# Patient Record
Sex: Male | Born: 1955 | Race: Black or African American | Hispanic: No | Marital: Married | Smoking: Never smoker
Health system: Southern US, Community
[De-identification: ages and names within clinical notes are randomized; demographics above are authoritative.]

## PROBLEM LIST (undated history)

## (undated) DIAGNOSIS — J189 Pneumonia, unspecified organism: Secondary | ICD-10-CM

## (undated) DIAGNOSIS — J45909 Unspecified asthma, uncomplicated: Secondary | ICD-10-CM

## (undated) HISTORY — PX: KNEE ARTHROSCOPY: SUR90

---

## 2013-10-01 ENCOUNTER — Emergency Department (HOSPITAL_COMMUNITY): Payer: 59

## 2013-10-01 ENCOUNTER — Encounter (HOSPITAL_COMMUNITY): Payer: Self-pay | Admitting: Emergency Medicine

## 2013-10-01 ENCOUNTER — Inpatient Hospital Stay (HOSPITAL_COMMUNITY)
Admission: EM | Admit: 2013-10-01 | Discharge: 2013-10-09 | DRG: 164 | Disposition: A | Discharge status: Home / Self Care | Payer: 59 | Attending: Surgery | Admitting: Surgery

## 2013-10-01 DIAGNOSIS — E860 Dehydration: Secondary | ICD-10-CM | POA: Diagnosis present

## 2013-10-01 DIAGNOSIS — J45909 Unspecified asthma, uncomplicated: Secondary | ICD-10-CM | POA: Diagnosis present

## 2013-10-01 DIAGNOSIS — J869 Pyothorax without fistula: Principal | ICD-10-CM | POA: Diagnosis present

## 2013-10-01 DIAGNOSIS — J9819 Other pulmonary collapse: Secondary | ICD-10-CM | POA: Diagnosis present

## 2013-10-01 DIAGNOSIS — Z8709 Personal history of other diseases of the respiratory system: Secondary | ICD-10-CM

## 2013-10-01 DIAGNOSIS — D509 Iron deficiency anemia, unspecified: Secondary | ICD-10-CM | POA: Diagnosis present

## 2013-10-01 DIAGNOSIS — D62 Acute posthemorrhagic anemia: Secondary | ICD-10-CM | POA: Diagnosis not present

## 2013-10-01 DIAGNOSIS — J90 Pleural effusion, not elsewhere classified: Secondary | ICD-10-CM | POA: Diagnosis present

## 2013-10-01 DIAGNOSIS — D72829 Elevated white blood cell count, unspecified: Secondary | ICD-10-CM | POA: Diagnosis present

## 2013-10-01 DIAGNOSIS — N179 Acute kidney failure, unspecified: Secondary | ICD-10-CM | POA: Diagnosis present

## 2013-10-01 HISTORY — DX: Unspecified asthma, uncomplicated: J45.909

## 2013-10-01 HISTORY — DX: Pneumonia, unspecified organism: J18.9

## 2013-10-01 LAB — BASIC METABOLIC PANEL
BUN: 28 mg/dL — AB (ref 6–23)
CO2: 27 meq/L (ref 19–32)
CREATININE: 1.66 mg/dL — AB (ref 0.50–1.35)
Calcium: 9.6 mg/dL (ref 8.4–10.5)
Chloride: 95 mEq/L — ABNORMAL LOW (ref 96–112)
GFR calc Af Amer: 52 mL/min — ABNORMAL LOW (ref >90)
GFR calc non Af Amer: 45 mL/min — ABNORMAL LOW (ref >90)
Glucose, Bld: 154 mg/dL — ABNORMAL HIGH (ref 70–99)
Potassium: 3.2 mEq/L — ABNORMAL LOW (ref 3.7–5.3)
Sodium: 136 mEq/L — ABNORMAL LOW (ref 137–147)

## 2013-10-01 LAB — D-DIMER, QUANTITATIVE: D-Dimer, Quant: 3.89 ug/mL-FEU — ABNORMAL HIGH (ref 0.00–0.48)

## 2013-10-01 LAB — CBC
HCT: 36.6 % — ABNORMAL LOW (ref 39.0–52.0)
Hemoglobin: 12.3 g/dL — ABNORMAL LOW (ref 13.0–17.0)
MCH: 25.4 pg — AB (ref 26.0–34.0)
MCHC: 33.6 g/dL (ref 30.0–36.0)
MCV: 75.6 fL — AB (ref 78.0–100.0)
Platelets: 274 10*3/uL (ref 150–400)
RBC: 4.84 MIL/uL (ref 4.22–5.81)
RDW: 13.6 % (ref 11.5–15.5)
WBC: 17.2 10*3/uL — ABNORMAL HIGH (ref 4.0–10.5)

## 2013-10-01 LAB — TROPONIN I: Troponin I: <0.3 ng/mL (ref <0.30)

## 2013-10-01 MED ORDER — VANCOMYCIN HCL IN DEXTROSE 1-5 GM/200ML-% IV SOLN
1000.0000 mg | Freq: Once | INTRAVENOUS | Status: AC
  Administered 2013-10-01: 1000 mg via INTRAVENOUS
  Filled 2013-10-01: qty 200

## 2013-10-01 MED ORDER — DEXTROSE 5 % IV SOLN
500.0000 mg | Freq: Once | INTRAVENOUS | Status: AC
  Administered 2013-10-01: 500 mg via INTRAVENOUS

## 2013-10-01 MED ORDER — IOHEXOL 300 MG/ML  SOLN
80.0000 mL | Freq: Once | INTRAMUSCULAR | Status: AC | PRN
  Administered 2013-10-01: 80 mL via INTRAVENOUS

## 2013-10-01 MED ORDER — DEXTROSE 5 % IV SOLN
1.0000 g | Freq: Once | INTRAVENOUS | Status: AC
  Administered 2013-10-01: 1 g via INTRAVENOUS
  Filled 2013-10-01: qty 10

## 2013-10-01 NOTE — ED Notes (Signed)
Pt was seen at [facility] on Monday night and was told he had borderline pneumonia. Pt was put on azithromycin and Bactrim

## 2013-10-01 NOTE — ED Provider Notes (Signed)
CSN: [id number]     Arrival date & time 10/01/13  1332 History   First MD Initiated Contact with Patient 10/01/13 1758   This chart was scribed for Nat Christen, MD by Terressa Koyanagi, ED Scribe. This patient was seen in room APA01/APA01 and the patient's care was started at 6:15 PM.  Chief Complaint  Patient presents with  . Shortness of Breath   The history is provided by the patient. No language interpreter was used.   HPI Comments: Darrell Woodard is a 58 y.o. male, with a history of asthma, who presents to the Emergency Department complaining of worsening SOB that is worse with exertion, onset five days ago. Pt also complains of associated subjective fever, chills, and right sided chest pain. Pt states that he began to experience right sided tenderness following chopping wood with an onset of 5 days ago. Pt reports that he went to [facility] for his right sided chest pain and SOB the next day.  [facility] conducted a PE and administered a muscle relaxer. Pt reports that his symptoms did not improve and he went back to a hospital one day later. Pt states that while at the hospital he received a CXR, labs, EKG and medications. The hospital diagnosed pt with pneumonia after finding a patchy lower right lobe consolidation. The hospital also prescribed the following antibiotics: Zithromax and Septra. Pt confirms he has been taking the antibiotics as directed and is also taking ibuprofen, all without relief. Pt is not a smoker.   Past Medical History  Diagnosis Date  . Asthma    Past Surgical History  Procedure Laterality Date  . Knee arthroscopy     No family history on file. History  Substance Use Topics  . Smoking status: Never Smoker   . Smokeless tobacco: Not on file  . Alcohol Use: No    Review of Systems  Constitutional: Positive for fever and chills.  Respiratory: Positive for shortness of breath.   Cardiovascular: Positive for chest pain (Right Sided Chest Pain ). Negative for leg  swelling.  All other systems reviewed and are negative.   Allergies  Review of patient's allergies indicates no known allergies.  Home Medications   Current Outpatient Rx  Name  Route  Sig  Dispense  Refill  . azithromycin (ZITHROMAX) 250 MG tablet   Oral   Take 250-500 mg by mouth daily. For 5 days         . ibuprofen (ADVIL,MOTRIN) 800 MG tablet   Oral   Take 800 mg by mouth every 8 (eight) hours as needed. pain         . oxyCODONE-acetaminophen (PERCOCET/ROXICET) 5-325 MG per tablet   Oral   Take 1 tablet by mouth every 6 (six) hours as needed. pain         . sulfamethoxazole-trimethoprim (BACTRIM DS) 800-160 MG per tablet   Oral   Take 2 tablets by mouth 2 (two) times daily.           Triage Vitals: BP 128/78  Pulse 101  Temp(Src) 98.4 F (36.9 C) (Oral)  Resp 21  Ht 6\' 1"  (1.854 m)  Wt 170 lb (77.111 kg)  BMI 22.43 kg/m2  SpO2 96%  Physical Exam  Nursing note and vitals reviewed. Constitutional: He is oriented to person, place, and time. He appears well-developed and well-nourished. No distress.  HENT:  Head: Normocephalic and atraumatic.  Eyes: Conjunctivae and EOM are normal. Pupils are equal, round, and reactive to light.  Neck: Normal  range of motion. Neck supple.  Cardiovascular: Normal rate, regular rhythm and normal heart sounds.   Pulmonary/Chest: Effort normal.  Decreased breath sounds bilaterally   Abdominal: Soft. Bowel sounds are normal.  Musculoskeletal: Normal range of motion.  Neurological: He is alert and oriented to person, place, and time.  Skin: Skin is warm and dry.  Psychiatric: He has a normal mood and affect. His behavior is normal.   ED Course  Procedures (including critical care time) DIAGNOSTIC STUDIES: Oxygen Saturation is 96% on RA, adequate by my interpretation.    COORDINATION OF CARE:  6:22 PM-Discussed treatment plan which includes CXR and labs with pt at bedside and pt agreed to plan.   Results for orders  placed during the hospital encounter of 10/01/13  CBC      Result Value Ref Range   WBC 17.2 (*) 4.0 - 10.5 K/uL   RBC 4.84  4.22 - 5.81 MIL/uL   Hemoglobin 12.3 (*) 13.0 - 17.0 g/dL   HCT 36.6 (*) 39.0 - 52.0 %   MCV 75.6 (*) 78.0 - 100.0 fL   MCH 25.4 (*) 26.0 - 34.0 pg   MCHC 33.6  30.0 - 36.0 g/dL   RDW 13.6  11.5 - 15.5 %   Platelets 274  150 - 400 K/uL  BASIC METABOLIC PANEL      Result Value Ref Range   Sodium 136 (*) 137 - 147 mEq/L   Potassium 3.2 (*) 3.7 - 5.3 mEq/L   Chloride 95 (*) 96 - 112 mEq/L   CO2 27  19 - 32 mEq/L   Glucose, Bld 154 (*) 70 - 99 mg/dL   BUN 28 (*) 6 - 23 mg/dL   Creatinine, Ser 1.66 (*) 0.50 - 1.35 mg/dL   Calcium 9.6  8.4 - 10.5 mg/dL   GFR calc non Af Amer 45 (*) >90 mL/min   GFR calc Af Amer 52 (*) >90 mL/min  TROPONIN I      Result Value Ref Range   Troponin I <0.30  <0.30 ng/mL  D-DIMER, QUANTITATIVE      Result Value Ref Range   D-Dimer, Quant 3.89 (*) 0.00 - 0.48 ug/mL-FEU   Dg Chest 2 View  10/01/2013   CLINICAL DATA:  Shortness of Breath  EXAM: CHEST  2 VIEW  COMPARISON:  None.  FINDINGS: There is infiltrate/ consolidation in right lower lobe with air bronchogram highly suspicious for pneumonia. Probable small right pleural effusion. There is a nodular density in left midlung laterally measures 1.2 cm. Further correlation with CT scan of the chest is recommended to exclude a lung mass.  IMPRESSION: There is infiltrate/ consolidation in right lower lobe with air bronchogram highly suspicious for pneumonia. Probable small right pleural effusion. There is a nodular density in left midlung laterally measures 1.2 cm. Further correlation with CT scan of the chest is recommended to exclude a lung mass.   Electronically Signed   By: Lahoma Crocker M.D.   On: 10/01/2013 18:53   CRITICAL CARE Performed by: Nat Christen  ?  Total critical care time: 30  Critical care time was exclusive of separately billable procedures and treating other  patients.  Critical care was necessary to treat or prevent imminent or life-threatening deterioration.  Critical care was time spent personally by me on the following activities: development of treatment plan with patient and/or surrogate as well as nursing, discussions with consultants, evaluation of patient's response to treatment, examination of patient, obtaining history from patient or surrogate, ordering  and performing treatments and interventions, ordering and review of laboratory studies, ordering and review of radiographic studies, pulse oximetry and re-evaluation of patient's condition.   MDM   Final diagnoses:  Empyema, right    Patient seen at [facility] recently and diagnosed with right-sided pneumonia. Rx Zithromax and Septra.   He has worsening right-sided chest pain will associated dyspnea on exertion. CT chest shows a probable empyema on the right. IV Zithromax and IV Rocephin. Discussed with hospitalist Dr Maryland Pink and cardiothoracic surgery.  Will transfer to [facility].     Nat Christen, MD 10/01/13 2207

## 2013-10-01 NOTE — ED Notes (Signed)
Pt states he was told he had pneumonia. States he went to his doctor for follow up and was told to come here to get rechecked

## 2013-10-01 NOTE — H&P (Signed)
[facility] History and Physical  Kordel Leavy SMO:[id number] DOB: Oct 31, 1955 DOA: 10/01/2013   PCP: Lonzo Cloud, MD  Specialists: None  Chief Complaint: Shortness of breath, and right-sided chest pain since Saturday  HPI: Darrell Woodard is a 58 y.o. male with a past medical history of asthma, which is under good control, and no other health problems and who was in his usual state of health till last Saturday when he started noticing shortness of breath. It was initially with exertion, but then subsequently even at rest. He also noticed right-sided chest pain, especially when he would do move his body. He went to an urgent care clinic on Sunday and was diagnosed with pulled muscle. He was prescribed muscle relaxants. He took his medication all day Sunday and on Monday he did not feel any better and so, he went to [facility]. He underwent a chest x-ray, EKG, and blood work there and was diagnosed with pneumonia. He was prescribed Bactrim and azithromycin along with pain medications. He took these medications for the last few days. Didn't feel much better and went to his primary care physician today who referred him to the emergency department. The chest pain is mainly with movement and with increased breathing when it gets up to 7/10 in intensity. He hasn't had any cough. Did have a fever of 101F a couple days ago. Had some chills. Had vomiting on Tuesday, but none prior to that. Denies any dizziness or lightheadedness. No history of smoking. No sick contacts.  Home Medications: Prior to Admission medications   Medication Sig Start Date End Date Taking? Authorizing Provider  azithromycin (ZITHROMAX) 250 MG tablet Take 250-500 mg by mouth daily. For 5 days 09/28/13  Yes Historical Provider, MD  ibuprofen (ADVIL,MOTRIN) 800 MG tablet Take 800 mg by mouth every 8 (eight) hours as needed. pain 09/28/13  Yes Historical Provider, MD  sulfamethoxazole-trimethoprim (BACTRIM DS) 800-160  MG per tablet Take 2 tablets by mouth 2 (two) times daily. 09/28/13  Yes Historical Provider, MD    Allergies:  Allergies  Allergen Reactions  . Oxycodone Other (See Comments)    Hallucinations    Past Medical History: Past Medical History  Diagnosis Date  . Asthma     Past Surgical History  Procedure Laterality Date  . Knee arthroscopy      Social History: Patient lives in [place], with his wife and son. He is retired from [employer]. No smoking, alcohol or illicit drug use. Usually independent with daily activities.  Family History:  Family History  Problem Relation Age of Onset  . Heart attack Father   . Heart attack Brother      Review of Systems - History obtained from the patient General ROS: positive for  - chills, fever and malaise Psychological ROS: negative Ophthalmic ROS: negative ENT ROS: negative Allergy and Immunology ROS: negative Hematological and Lymphatic ROS: negative Endocrine ROS: negative Respiratory ROS: as in hpi Cardiovascular ROS: as in hpi Gastrointestinal ROS: no abdominal pain, change in bowel habits, or black or bloody stools Genito-Urinary ROS: no dysuria, trouble voiding, or hematuria Musculoskeletal ROS: negative Neurological ROS: no TIA or stroke symptoms Dermatological ROS: negative  Physical Examination  Filed Vitals:   10/01/13 1349 10/01/13 1805 10/01/13 2243  BP: 142/66 128/78 109/60  Pulse: 103 101 95  Temp: 98.4 F (36.9 C)  99.3 F (37.4 C)  TempSrc: Oral  Oral  Resp: 20 21 21   Height: 6' 1"  (1.854 m)    Weight: 77.111 kg (  170 lb)    SpO2: 100% 96% 95%    BP 109/60  Pulse 95  Temp(Src) 99.3 F (37.4 C) (Oral)  Resp 21  Ht 6' 1"  (1.854 m)  Wt 77.111 kg (170 lb)  BMI 22.43 kg/m2  SpO2 95%  General appearance: alert, cooperative, appears stated age and no distress Head: Normocephalic, without obvious abnormality, atraumatic Eyes: conjunctivae/corneas clear. PERRL, EOM's intact. Throat: lips, mucosa, and  tongue normal; teeth and gums normal Neck: no adenopathy, no carotid bruit, no JVD, supple, symmetrical, trachea midline and thyroid not enlarged, symmetric, no tenderness/mass/nodules Back: symmetric, no curvature. ROM normal. No CVA tenderness. Resp: Crackles and rhonchi in right lung with dullness to percussion. Cardio: regular rate and rhythm, S1, S2 normal, no murmur, click, rub or gallop GI: soft, non-tender; bowel sounds normal; no masses,  no organomegaly Extremities: extremities normal, atraumatic, no cyanosis or edema Pulses: 2+ and symmetric Skin: Skin color, texture, turgor normal. No rashes or lesions Lymph nodes: Cervical, supraclavicular, and axillary nodes normal. Neurologic: She is alert and oriented x3. Cranial nerves are intact. No focal neurological deficits are present.  Laboratory Data: Results for orders placed during the hospital encounter of 10/01/13 (from the past 48 hour(s))  CBC     Status: Abnormal   Collection Time    10/01/13  6:17 PM      Result Value Ref Range   WBC 17.2 (*) 4.0 - 10.5 K/uL   RBC 4.84  4.22 - 5.81 MIL/uL   Hemoglobin 12.3 (*) 13.0 - 17.0 g/dL   HCT 36.6 (*) 39.0 - 52.0 %   MCV 75.6 (*) 78.0 - 100.0 fL   MCH 25.4 (*) 26.0 - 34.0 pg   MCHC 33.6  30.0 - 36.0 g/dL   RDW 13.6  11.5 - 15.5 %   Platelets 274  150 - 400 K/uL  BASIC METABOLIC PANEL     Status: Abnormal   Collection Time    10/01/13  6:17 PM      Result Value Ref Range   Sodium 136 (*) 137 - 147 mEq/L   Potassium 3.2 (*) 3.7 - 5.3 mEq/L   Chloride 95 (*) 96 - 112 mEq/L   CO2 27  19 - 32 mEq/L   Glucose, Bld 154 (*) 70 - 99 mg/dL   BUN 28 (*) 6 - 23 mg/dL   Creatinine, Ser 1.66 (*) 0.50 - 1.35 mg/dL   Calcium 9.6  8.4 - 10.5 mg/dL   GFR calc non Af Amer 45 (*) >90 mL/min   GFR calc Af Amer 52 (*) >90 mL/min   Comment: (NOTE)     The eGFR has been calculated using the CKD EPI equation.     This calculation has not been validated in all clinical situations.     eGFR's  persistently <90 mL/min signify possible Chronic Kidney     Disease.  TROPONIN I     Status: None   Collection Time    10/01/13  6:17 PM      Result Value Ref Range   Troponin I <0.30  <0.30 ng/mL   Comment:            Due to the release kinetics of cTnI,     a negative result within the first hours     of the onset of symptoms does not rule out     myocardial infarction with certainty.     If myocardial infarction is still suspected,     repeat the test  at appropriate intervals.  D-DIMER, QUANTITATIVE     Status: Abnormal   Collection Time    10/01/13  6:17 PM      Result Value Ref Range   D-Dimer, Quant 3.89 (*) 0.00 - 0.48 ug/mL-FEU   Comment:            AT THE INHOUSE ESTABLISHED CUTOFF     VALUE OF 0.48 ug/mL FEU,     THIS ASSAY HAS BEEN DOCUMENTED     IN THE LITERATURE TO HAVE     A SENSITIVITY AND NEGATIVE     PREDICTIVE VALUE OF AT LEAST     98 TO 99%.  THE TEST RESULT     SHOULD BE CORRELATED WITH     AN ASSESSMENT OF THE CLINICAL     PROBABILITY OF DVT / VTE.    Radiology Reports: Dg Chest 2 View  10/01/2013   CLINICAL DATA:  Shortness of Breath  EXAM: CHEST  2 VIEW  COMPARISON:  None.  FINDINGS: There is infiltrate/ consolidation in right lower lobe with air bronchogram highly suspicious for pneumonia. Probable small right pleural effusion. There is a nodular density in left midlung laterally measures 1.2 cm. Further correlation with CT scan of the chest is recommended to exclude a lung mass.  IMPRESSION: There is infiltrate/ consolidation in right lower lobe with air bronchogram highly suspicious for pneumonia. Probable small right pleural effusion. There is a nodular density in left midlung laterally measures 1.2 cm. Further correlation with CT scan of the chest is recommended to exclude a lung mass.   Electronically Signed   By: Lahoma Crocker M.D.   On: 10/01/2013 18:53   Ct Chest W Contrast  10/01/2013   CLINICAL DATA:  Shortness of breath and chest pain.  EXAM: CT  CHEST WITH CONTRAST  TECHNIQUE: Multidetector CT imaging of the chest was performed during intravenous contrast administration.  CONTRAST:  69m OMNIPAQUE IOHEXOL 300 MG/ML  SOLN  COMPARISON:  DG CHEST 2 VIEW dated 10/01/2013  FINDINGS: No pathologically enlarged mediastinal, hilar or axillary lymph nodes. Heart size normal. No pericardial effusion. There are small juxta diaphragmatic lymph nodes.  A highly loculated collection of pleural fluid and air is seen on the right with slight pleural thickening. Largest collection of fluid is seen along the right major fissure. There is associated compressive atelectasis in the adjacent portions of the right lung. A nodule in the subpleural left upper lobe measures 9 x 10 mm (image 29). Airway is unremarkable.  Incidental imaging of the upper abdomen suggests slight irregularity of the left hepatic lobe margin. Scattered sub cm low-attenuation lesions in the liver are too small to characterize. Visualized portions of the left adrenal gland, left kidney, spleen, pancreas, stomach and bowel are grossly unremarkable. No worrisome lytic or sclerotic lesions.  IMPRESSION: 1. Highly loculated collection of pleural fluid and air in the right hemi thorax, indicative of empyema. Difficult to definitively exclude malignancy. 2. 10 mm left upper lobe nodule. Initial followup CT in 3 months is recommended in further evaluation, as adenocarcinoma can have this appearance. This recommendation follows the consensus statement: Guidelines for Management of Small Pulmonary Nodules Detected on CT Scans: A Statement from the [place] published in Radiology 2005; 237:395-400. 3. These results were called by telephone at the time of interpretation on 10/01/2013 at 8:21 PM to Dr. BNat Christen, who verbally acknowledged these results. 4. Question mild cirrhosis.   Electronically Signed   By: MLorin PicketM.D.  On: 10/01/2013 20:21    Problem List  Principal Problem:    Empyema Active Problems:   History of asthma   Dehydration   ARF (acute renal failure)   Microcytic anemia   Assessment: This is a 58 year old, African American male, who presents with shortness of breath, and right-sided chest pain, ongoing for the last one week without any improvement with oral antibiotics. CT scan findings are concerning for right-sided empyema.  Plan: #1 right lung empyema: Patient will need cardiothoracic consultation and possible intervention. He'll be transferred to [facility]. Cardiothoracic surgeon, Dr. Cyndia Bent has been notified by the ED physician. He'll be placed on vancomycin and Zosyn for now. Patient is saturating well on room air. He's hemodynamically stable.  #2 dehydration with mild acute renal failure: Will be given IV fluids. Monitor urine output closely. Repeat renal function in the morning.  #3 microcytic anemia: Anemia panel will be checked in the morning.  #4 history of asthma: Appears to be stable. Albuterol as needed.   #5 left upper lung nodule: This will require outpatient followup.   DVT Prophylaxis: SCDs Code Status: Full code Family Communication: Discussed with the patient, his son and daughter  Disposition Plan: Transfer to [facility].   Further management decisions will depend on results of further testing and patient's response to treatment.  [facility]  [facility] Pager [phone]  If 7PM-7AM, please contact night-coverage [email or web address] Password [facility]  10/01/2013, 11:15 PM

## 2013-10-02 ENCOUNTER — Encounter (HOSPITAL_COMMUNITY): Payer: Self-pay | Admitting: Nurse Practitioner

## 2013-10-02 DIAGNOSIS — Z8709 Personal history of other diseases of the respiratory system: Secondary | ICD-10-CM

## 2013-10-02 DIAGNOSIS — D72829 Elevated white blood cell count, unspecified: Secondary | ICD-10-CM

## 2013-10-02 DIAGNOSIS — J869 Pyothorax without fistula: Secondary | ICD-10-CM

## 2013-10-02 LAB — VITAMIN B12: VITAMIN B 12: 244 pg/mL (ref 211–911)

## 2013-10-02 LAB — COMPREHENSIVE METABOLIC PANEL
ALT: 32 U/L (ref 0–53)
AST: 33 U/L (ref 0–37)
Albumin: 2.3 g/dL — ABNORMAL LOW (ref 3.5–5.2)
Alkaline Phosphatase: 107 U/L (ref 39–117)
BILIRUBIN TOTAL: 1 mg/dL (ref 0.3–1.2)
BUN: 23 mg/dL (ref 6–23)
CHLORIDE: 95 meq/L — AB (ref 96–112)
CO2: 27 meq/L (ref 19–32)
Calcium: 9.1 mg/dL (ref 8.4–10.5)
Creatinine, Ser: 1.59 mg/dL — ABNORMAL HIGH (ref 0.50–1.35)
GFR, EST AFRICAN AMERICAN: 54 mL/min — AB (ref >90)
GFR, EST NON AFRICAN AMERICAN: 47 mL/min — AB (ref >90)
GLUCOSE: 153 mg/dL — AB (ref 70–99)
POTASSIUM: 3.7 meq/L (ref 3.7–5.3)
SODIUM: 135 meq/L — AB (ref 137–147)
Total Protein: 7 g/dL (ref 6.0–8.3)

## 2013-10-02 LAB — IRON AND TIBC
Iron: 30 ug/dL — ABNORMAL LOW (ref 42–135)
Saturation Ratios: 20 % (ref 20–55)
TIBC: 147 ug/dL — ABNORMAL LOW (ref 215–435)
UIBC: 117 ug/dL — AB (ref 125–400)

## 2013-10-02 LAB — RETICULOCYTES
RBC.: 4.48 MIL/uL (ref 4.22–5.81)
Retic Count, Absolute: 40.3 10*3/uL (ref 19.0–186.0)
Retic Ct Pct: 0.9 % (ref 0.4–3.1)

## 2013-10-02 LAB — CBC
HEMATOCRIT: 33.3 % — AB (ref 39.0–52.0)
Hemoglobin: 11.5 g/dL — ABNORMAL LOW (ref 13.0–17.0)
MCH: 25.7 pg — ABNORMAL LOW (ref 26.0–34.0)
MCHC: 34.5 g/dL (ref 30.0–36.0)
MCV: 74.3 fL — ABNORMAL LOW (ref 78.0–100.0)
Platelets: 246 10*3/uL (ref 150–400)
RBC: 4.48 MIL/uL (ref 4.22–5.81)
RDW: 13.6 % (ref 11.5–15.5)
WBC: 17.9 10*3/uL — ABNORMAL HIGH (ref 4.0–10.5)

## 2013-10-02 LAB — STREP PNEUMONIAE URINARY ANTIGEN: Strep Pneumo Urinary Antigen: NEGATIVE

## 2013-10-02 LAB — FERRITIN: Ferritin: 1387 ng/mL — ABNORMAL HIGH (ref 22–322)

## 2013-10-02 LAB — FOLATE: FOLATE: 5.8 ng/mL

## 2013-10-02 MED ORDER — MORPHINE SULFATE 2 MG/ML IJ SOLN: 1.0000 mg | INTRAMUSCULAR | Status: DC | PRN

## 2013-10-02 MED ORDER — HYDROCODONE-ACETAMINOPHEN 5-325 MG PO TABS
1.0000 | ORAL_TABLET | ORAL | Status: DC | PRN
Start: 2013-10-02 — End: 2013-10-03

## 2013-10-02 MED ORDER — ACETAMINOPHEN 325 MG PO TABS: 650.0000 mg | ORAL_TABLET | Freq: Four times a day (QID) | ORAL | Status: DC | PRN

## 2013-10-02 MED ORDER — ALBUTEROL SULFATE (2.5 MG/3ML) 0.083% IN NEBU: 2.5000 mg | INHALATION_SOLUTION | RESPIRATORY_TRACT | Status: DC | PRN

## 2013-10-02 MED ORDER — VANCOMYCIN HCL IN DEXTROSE 750-5 MG/150ML-% IV SOLN
750.0000 mg | Freq: Two times a day (BID) | INTRAVENOUS | Status: DC
  Administered 2013-10-02 – 2013-10-04 (×5): 750 mg via INTRAVENOUS
  Filled 2013-10-02 (×8): qty 150

## 2013-10-02 MED ORDER — ACETAMINOPHEN 650 MG RE SUPP: 650.0000 mg | Freq: Four times a day (QID) | RECTAL | Status: DC | PRN

## 2013-10-02 MED ORDER — POTASSIUM CHLORIDE CRYS ER 20 MEQ PO TBCR
20.0000 meq | EXTENDED_RELEASE_TABLET | Freq: Once | ORAL | Status: AC
  Administered 2013-10-02: 20 meq via ORAL
  Filled 2013-10-02: qty 1

## 2013-10-02 MED ORDER — ONDANSETRON HCL 4 MG/2ML IJ SOLN: 4.0000 mg | Freq: Four times a day (QID) | INTRAMUSCULAR | Status: DC | PRN

## 2013-10-02 MED ORDER — BUDESONIDE 0.25 MG/2ML IN SUSP
0.2500 mg | Freq: Two times a day (BID) | RESPIRATORY_TRACT | Status: DC
  Administered 2013-10-02: 0.25 mg via RESPIRATORY_TRACT
  Filled 2013-10-02 (×4): qty 2

## 2013-10-02 MED ORDER — ONDANSETRON HCL 4 MG PO TABS: 4.0000 mg | ORAL_TABLET | Freq: Four times a day (QID) | ORAL | Status: DC | PRN

## 2013-10-02 MED ORDER — SODIUM CHLORIDE 0.9 % IJ SOLN
3.0000 mL | Freq: Two times a day (BID) | INTRAMUSCULAR | Status: DC
Start: 2013-10-02 — End: 2013-10-03

## 2013-10-02 MED ORDER — PNEUMOCOCCAL VAC POLYVALENT 25 MCG/0.5ML IJ INJ
0.5000 mL | INJECTION | INTRAMUSCULAR | Status: DC
  Filled 2013-10-02: qty 0.5

## 2013-10-02 MED ORDER — PIPERACILLIN-TAZOBACTAM 3.375 G IVPB
3.3750 g | Freq: Three times a day (TID) | INTRAVENOUS | Status: DC
Start: 2013-10-02 — End: 2013-10-09
  Administered 2013-10-02 – 2013-10-09 (×21): 3.375 g via INTRAVENOUS
  Filled 2013-10-02 (×27): qty 50

## 2013-10-02 MED ORDER — SODIUM CHLORIDE 0.9 % IV SOLN
INTRAVENOUS | Status: DC
  Administered 2013-10-03: 06:00:00 via INTRAVENOUS

## 2013-10-02 MED ORDER — PIPERACILLIN-TAZOBACTAM 3.375 G IVPB 30 MIN
3.3750 g | Freq: Once | INTRAVENOUS | Status: AC
  Administered 2013-10-02: 3.375 g via INTRAVENOUS
  Filled 2013-10-02: qty 50

## 2013-10-02 NOTE — Progress Notes (Signed)
ANTIBIOTIC CONSULT NOTE - INITIAL  Pharmacy Consult for Vancocin and Zosyn Indication: empyema  Allergies  Allergen Reactions  . Oxycodone Other (See Comments)    Hallucinations    Patient Measurements: Height: 6\' 1"  (185.4 cm) Weight: 166 lb 11.2 oz (75.615 kg) (scale c) IBW/kg (Calculated) : 79.9  Vital Signs: Temp: 99.8 F (37.7 C) (02/28 0018) Temp src: Oral (02/28 0018) BP: 118/71 mmHg (02/28 0018) Pulse Rate: 97 (02/28 0018)  Labs:  Recent Labs  10/01/13 1817  WBC 17.2*  HGB 12.3*  PLT 274  CREATININE 1.66*   Estimated Creatinine Clearance: 53.1 ml/min (by C-G formula based on Cr of 1.66).   Microbiology: No results found for this or any previous visit (from the past 720 hour(s)).  Medical History: Past Medical History  Diagnosis Date  . Asthma   . Pneumonia     Medications:  Prescriptions prior to admission  Medication Sig Dispense Refill  . azithromycin (ZITHROMAX) 250 MG tablet Take 250-500 mg by mouth daily. For 5 days      . ibuprofen (ADVIL,MOTRIN) 800 MG tablet Take 800 mg by mouth every 8 (eight) hours as needed. pain      . sulfamethoxazole-trimethoprim (BACTRIM DS) 800-160 MG per tablet Take 2 tablets by mouth 2 (two) times daily.       Scheduled:  . piperacillin-tazobactam  3.375 g Intravenous Once  . piperacillin-tazobactam (ZOSYN)  IV  3.375 g Intravenous Q8H  . potassium chloride  20 mEq Oral Once  . sodium chloride  3 mL Intravenous Q12H  . vancomycin  750 mg Intravenous Q12H   Infusions:  . sodium chloride      Assessment: 58yo male c/o SOB w/ exertion and eventually progressed to SOB at rest, pain worsened w/ body movement, an urgent care clinic dx'd w/ pulled muscle and gave muscle relaxants which did not relieve Sx, pt then went to hospital where he was dx'd w/ PNA and given outpt Bactrim and azithro which failed to improve Sx, PCP sent pt to [facility], CT here indicative of empyema, to begin IV ABX.  Goal of Therapy:  Vancomycin  trough level 15-20 mcg/ml  Plan:  Rec'd vanc 1g, azithro, and Rocephin in ED; will continue with vancomycin 750mg  IV Q12H and change to Zosyn 3.375g IV Q8H and monitor CBC, Cx, levels prn.  Wynona Neat, PharmD, BCPS  10/02/2013,12:33 AM

## 2013-10-02 NOTE — Progress Notes (Signed)
[facility] progress note. Chief complaint. Transfer note. This 58 year old male admitted through [facility] emergency room with complaints of dyspnea and right-sided chest pain. He was felt to have a right lung empyema which would require a cardiothoracic consultation and possible intervention thus he was transferred to [facility]. Patient is now arrived am seeing the patient at bedside to ensure he remained stable and that his orders transferred appropriately. Patient has no acute complaints currently. Vital signs. Temperature 99.8, pulse 97, respiration 18, blood pressure 118/71. O2 sats 97%. General appearance. Thin middle-aged male who is alert, cooperative, and in no distress. Cardiac. Rate and rhythm regular. Abdomen. Soft with positive bowel sounds. Lungs. Breath sounds are somewhat reduced in the bases but clear throughout with no distress or cough. Impression/plan. Right lung empyema. Patient on broad spectrum antibiotics including vancomycin and Zosyn. Cardiothoracic surgeon was notified of patient transfer from emergency room. Patient clinically stable at time of this exam and all orders appear to have transferred appropriately.

## 2013-10-02 NOTE — Progress Notes (Signed)
[facility] PROGRESS NOTE  Darrell Woodard PIR:[id number] DOB: 1955-09-12 DOA: 10/01/2013 PCP: Lonzo Cloud, MD  Assessment/Plan: #1 right lung empyema: Patient will need cardiothoracic consultation and possible intervention. -Dr. Cyndia Bent (CVTS) to see patient. Will follow rec's -continue broad spectrum antibiotics -start pulmicort and flutter valve  #2 dehydration with mild acute renal failure: improving. -will continue IVF's  #3 microcytic anemia: -will follow anemia panel results  #4 history of asthma: Appears to be stable. Albuterol as needed. Start pulmicort  #5 left upper lung nodule: This will require outpatient followup.  #6:leukocytosis and low grade fever: due to #1. Continue current treatment.  DVT: SCD's  Code Status: Full Family Communication: daughter at bedside Disposition Plan: to be determine    Consultants:  CVTS (Dr. Cyndia Bent)  Procedures:  See below for x-ray reports   Antibiotics:  vanc and cefepime 2/27  HPI/Subjective: Low grade fever; still with pleuritic CP. SOB improved.  Objective: Filed Vitals:   10/02/13 0431  BP: 123/76  Pulse: 92  Temp: 98.9 F (37.2 C)  Resp: 18    Intake/Output Summary (Last 24 hours) at 10/02/13 1446 Last data filed at 10/02/13 0659  Gross per 24 hour  Intake 711.67 ml  Output    250 ml  Net 461.67 ml   Filed Weights   10/01/13 1349 10/02/13 0018  Weight: 77.111 kg (170 lb) 75.615 kg (166 lb 11.2 oz)    Exam:   General:  Low grade temp, complaining of pleuritic CP. SOB slightly improved.  Cardiovascular: S1 and S2, no rubs or gallops  Respiratory: decrease BS mid and lower sections on right lungs; positive slight exp wheezing. Otherwise clear  Abdomen: soft, NT, ND, positive BS  Musculoskeletal: no joint swelling, FROM  Data Reviewed: Basic Metabolic Panel:  Recent Labs Lab 10/01/13 1817 10/02/13 0134  NA 136* 135*  K 3.2* 3.7  CL 95* 95*  CO2 27 27  GLUCOSE 154* 153*  BUN 28*  23  CREATININE 1.66* 1.59*  CALCIUM 9.6 9.1   Liver Function Tests:  Recent Labs Lab 10/02/13 0134  AST 33  ALT 32  ALKPHOS 107  BILITOT 1.0  PROT 7.0  ALBUMIN 2.3*   CBC:  Recent Labs Lab 10/01/13 1817 10/02/13 0134  WBC 17.2* 17.9*  HGB 12.3* 11.5*  HCT 36.6* 33.3*  MCV 75.6* 74.3*  PLT 274 246   Cardiac Enzymes:  Recent Labs Lab 10/01/13 1817  TROPONINI <0.30    Studies: Dg Chest 2 View  10/01/2013   CLINICAL DATA:  Shortness of Breath  EXAM: CHEST  2 VIEW  COMPARISON:  None.  FINDINGS: There is infiltrate/ consolidation in right lower lobe with air bronchogram highly suspicious for pneumonia. Probable small right pleural effusion. There is a nodular density in left midlung laterally measures 1.2 cm. Further correlation with CT scan of the chest is recommended to exclude a lung mass.  IMPRESSION: There is infiltrate/ consolidation in right lower lobe with air bronchogram highly suspicious for pneumonia. Probable small right pleural effusion. There is a nodular density in left midlung laterally measures 1.2 cm. Further correlation with CT scan of the chest is recommended to exclude a lung mass.   Electronically Signed   By: Lahoma Crocker M.D.   On: 10/01/2013 18:53   Ct Chest W Contrast  10/01/2013   CLINICAL DATA:  Shortness of breath and chest pain.  EXAM: CT CHEST WITH CONTRAST  TECHNIQUE: Multidetector CT imaging of the chest was performed during intravenous contrast administration.  CONTRAST:  48mL OMNIPAQUE IOHEXOL 300 MG/ML  SOLN  COMPARISON:  DG CHEST 2 VIEW dated 10/01/2013  FINDINGS: No pathologically enlarged mediastinal, hilar or axillary lymph nodes. Heart size normal. No pericardial effusion. There are small juxta diaphragmatic lymph nodes.  A highly loculated collection of pleural fluid and air is seen on the right with slight pleural thickening. Largest collection of fluid is seen along the right major fissure. There is associated compressive atelectasis in the  adjacent portions of the right lung. A nodule in the subpleural left upper lobe measures 9 x 10 mm (image 29). Airway is unremarkable.  Incidental imaging of the upper abdomen suggests slight irregularity of the left hepatic lobe margin. Scattered sub cm low-attenuation lesions in the liver are too small to characterize. Visualized portions of the left adrenal gland, left kidney, spleen, pancreas, stomach and bowel are grossly unremarkable. No worrisome lytic or sclerotic lesions.  IMPRESSION: 1. Highly loculated collection of pleural fluid and air in the right hemi thorax, indicative of empyema. Difficult to definitively exclude malignancy. 2. 10 mm left upper lobe nodule. Initial followup CT in 3 months is recommended in further evaluation, as adenocarcinoma can have this appearance. This recommendation follows the consensus statement: Guidelines for Management of Small Pulmonary Nodules Detected on CT Scans: A Statement from the [place] as published in Radiology 2005; 237:395-400. 3. These results were called by telephone at the time of interpretation on 10/01/2013 at 8:21 PM to Dr. Nat Christen , who verbally acknowledged these results. 4. Question mild cirrhosis.   Electronically Signed   By: Lorin Picket M.D.   On: 10/01/2013 20:21    Scheduled Meds: . piperacillin-tazobactam (ZOSYN)  IV  3.375 g Intravenous Q8H  . [START ON 10/03/2013] pneumococcal 23 valent vaccine  0.5 mL Intramuscular Tomorrow-1000  . sodium chloride  3 mL Intravenous Q12H  . vancomycin  750 mg Intravenous Q12H   Continuous Infusions: . sodium chloride 100 mL/hr at 10/02/13 5329    Principal Problem:   Empyema Active Problems:   History of asthma   Dehydration   ARF (acute renal failure)   Microcytic anemia    Time spent: >30 minutes   [name]  [facility] Pager [phone]. If 7PM-7AM, please contact night-coverage at [email or web address], password [facility] 10/02/2013, 2:46 PM  LOS: 1 day

## 2013-10-02 NOTE — Consult Note (Signed)
Cardiothoracic Surgery Consultation  Reason for Consult: Right empyema Referring Physician: Dr. Barton Dubois  Darrell Woodard is an 58 y.o. male.  HPI:   The patient is a 58 year old nonsmoker with asthma who developed some right chest pain and shortness of breath last Saturday. He went to an Urgent Care on Sunday and says he was told he had a pulled muscle. He was given muscle relaxer and sent home. He took the medication Sunday and Monday but did not feel better and on Monday went to [facility] where he had a CXR and bloodwork and was told he had pneumonia. He says he was given IV fluids, and IV antibiotic injection and felt better so he was sent home on Bactrim and Azithromycin. He took these but did not feel better and followed up with his primary MD on Friday who referred him to the ER. He went to [facility]. A Chest CT scan showed a multiloculated right pleural empyema and he was transferred to [facility] overnight.   Past Medical History  Diagnosis Date  . Asthma   . Pneumonia     Past Surgical History  Procedure Laterality Date  . Knee arthroscopy      Family History  Problem Relation Age of Onset  . Heart attack Father   . Heart attack Brother     Social History:  reports that he has never smoked. He has never used smokeless tobacco. He reports that he does not drink alcohol or use illicit drugs.  Allergies:  Allergies  Allergen Reactions  . Oxycodone Other (See Comments)    Hallucinations    Medications:  I have reviewed the patient's current medications. Prior to Admission:  Prescriptions prior to admission  Medication Sig Dispense Refill  . azithromycin (ZITHROMAX) 250 MG tablet Take 250-500 mg by mouth daily. For 5 days      . ibuprofen (ADVIL,MOTRIN) 800 MG tablet Take 800 mg by mouth every 8 (eight) hours as needed. pain      . sulfamethoxazole-trimethoprim (BACTRIM DS) 800-160 MG per tablet Take 2 tablets by mouth 2 (two) times daily.        Scheduled: . budesonide (PULMICORT) nebulizer solution  0.25 mg Nebulization BID  . piperacillin-tazobactam (ZOSYN)  IV  3.375 g Intravenous Q8H  . [START ON 10/03/2013] pneumococcal 23 valent vaccine  0.5 mL Intramuscular Tomorrow-1000  . sodium chloride  3 mL Intravenous Q12H  . vancomycin  750 mg Intravenous Q12H   Continuous: . sodium chloride 100 mL/hr at 10/02/13 0052   HCW:[id number], acetaminophen, albuterol, HYDROcodone-acetaminophen, morphine injection, ondansetron (ZOFRAN) IV, ondansetron  Results for orders placed during the hospital encounter of 10/01/13 (from the past 48 hour(s))  CBC     Status: Abnormal   Collection Time    10/01/13  6:17 PM      Result Value Ref Range   WBC 17.2 (*) 4.0 - 10.5 K/uL   RBC 4.84  4.22 - 5.81 MIL/uL   Hemoglobin 12.3 (*) 13.0 - 17.0 g/dL   HCT 36.6 (*) 39.0 - 52.0 %   MCV 75.6 (*) 78.0 - 100.0 fL   MCH 25.4 (*) 26.0 - 34.0 pg   MCHC 33.6  30.0 - 36.0 g/dL   RDW 13.6  11.5 - 15.5 %   Platelets 274  150 - 400 K/uL  BASIC METABOLIC PANEL     Status: Abnormal   Collection Time    10/01/13  6:17 PM      Result Value Ref  Range   Sodium 136 (*) 137 - 147 mEq/L   Potassium 3.2 (*) 3.7 - 5.3 mEq/L   Chloride 95 (*) 96 - 112 mEq/L   CO2 27  19 - 32 mEq/L   Glucose, Bld 154 (*) 70 - 99 mg/dL   BUN 28 (*) 6 - 23 mg/dL   Creatinine, Ser 1.66 (*) 0.50 - 1.35 mg/dL   Calcium 9.6  8.4 - 10.5 mg/dL   GFR calc non Af Amer 45 (*) >90 mL/min   GFR calc Af Amer 52 (*) >90 mL/min   Comment: (NOTE)     The eGFR has been calculated using the CKD EPI equation.     This calculation has not been validated in all clinical situations.     eGFR's persistently <90 mL/min signify possible Chronic Kidney     Disease.  TROPONIN I     Status: None   Collection Time    10/01/13  6:17 PM      Result Value Ref Range   Troponin I <0.30  <0.30 ng/mL   Comment:            Due to the release kinetics of cTnI,     a negative result within the first hours      of the onset of symptoms does not rule out     myocardial infarction with certainty.     If myocardial infarction is still suspected,     repeat the test at appropriate intervals.  D-DIMER, QUANTITATIVE     Status: Abnormal   Collection Time    10/01/13  6:17 PM      Result Value Ref Range   D-Dimer, Quant 3.89 (*) 0.00 - 0.48 ug/mL-FEU   Comment:            AT THE INHOUSE ESTABLISHED CUTOFF     VALUE OF 0.48 ug/mL FEU,     THIS ASSAY HAS BEEN DOCUMENTED     IN THE LITERATURE TO HAVE     A SENSITIVITY AND NEGATIVE     PREDICTIVE VALUE OF AT LEAST     98 TO 99%.  THE TEST RESULT     SHOULD BE CORRELATED WITH     AN ASSESSMENT OF THE CLINICAL     PROBABILITY OF DVT / VTE.  COMPREHENSIVE METABOLIC PANEL     Status: Abnormal   Collection Time    10/02/13  1:34 AM      Result Value Ref Range   Sodium 135 (*) 137 - 147 mEq/L   Potassium 3.7  3.7 - 5.3 mEq/L   Chloride 95 (*) 96 - 112 mEq/L   CO2 27  19 - 32 mEq/L   Glucose, Bld 153 (*) 70 - 99 mg/dL   BUN 23  6 - 23 mg/dL   Creatinine, Ser 1.59 (*) 0.50 - 1.35 mg/dL   Calcium 9.1  8.4 - 10.5 mg/dL   Total Protein 7.0  6.0 - 8.3 g/dL   Albumin 2.3 (*) 3.5 - 5.2 g/dL   AST 33  0 - 37 U/L   ALT 32  0 - 53 U/L   Alkaline Phosphatase 107  39 - 117 U/L   Total Bilirubin 1.0  0.3 - 1.2 mg/dL   GFR calc non Af Amer 47 (*) >90 mL/min   GFR calc Af Amer 54 (*) >90 mL/min   Comment: (NOTE)     The eGFR has been calculated using the CKD EPI equation.     This  calculation has not been validated in all clinical situations.     eGFR's persistently <90 mL/min signify possible Chronic Kidney     Disease.  CBC     Status: Abnormal   Collection Time    10/02/13  1:34 AM      Result Value Ref Range   WBC 17.9 (*) 4.0 - 10.5 K/uL   RBC 4.48  4.22 - 5.81 MIL/uL   Hemoglobin 11.5 (*) 13.0 - 17.0 g/dL   HCT 33.3 (*) 39.0 - 52.0 %   MCV 74.3 (*) 78.0 - 100.0 fL   MCH 25.7 (*) 26.0 - 34.0 pg   MCHC 34.5  30.0 - 36.0 g/dL   RDW 13.6  11.5  - 15.5 %   Platelets 246  150 - 400 K/uL  RETICULOCYTES     Status: None   Collection Time    10/02/13  1:34 AM      Result Value Ref Range   Retic Ct Pct 0.9  0.4 - 3.1 %   RBC. 4.48  4.22 - 5.81 MIL/uL   Retic Count, Manual 40.3  19.0 - 186.0 K/uL  STREP PNEUMONIAE URINARY ANTIGEN     Status: None   Collection Time    10/02/13  4:43 AM      Result Value Ref Range   Strep Pneumo Urinary Antigen NEGATIVE  NEGATIVE   Comment:            Infection due to S. pneumoniae     cannot be absolutely ruled out     since the antigen present     may be below the detection limit     of the test.    Dg Chest 2 View  10/01/2013   CLINICAL DATA:  Shortness of Breath  EXAM: CHEST  2 VIEW  COMPARISON:  None.  FINDINGS: There is infiltrate/ consolidation in right lower lobe with air bronchogram highly suspicious for pneumonia. Probable small right pleural effusion. There is a nodular density in left midlung laterally measures 1.2 cm. Further correlation with CT scan of the chest is recommended to exclude a lung mass.  IMPRESSION: There is infiltrate/ consolidation in right lower lobe with air bronchogram highly suspicious for pneumonia. Probable small right pleural effusion. There is a nodular density in left midlung laterally measures 1.2 cm. Further correlation with CT scan of the chest is recommended to exclude a lung mass.   Electronically Signed   By: Lahoma Crocker M.D.   On: 10/01/2013 18:53   Ct Chest W Contrast  10/01/2013   CLINICAL DATA:  Shortness of breath and chest pain.  EXAM: CT CHEST WITH CONTRAST  TECHNIQUE: Multidetector CT imaging of the chest was performed during intravenous contrast administration.  CONTRAST:  31m OMNIPAQUE IOHEXOL 300 MG/ML  SOLN  COMPARISON:  DG CHEST 2 VIEW dated 10/01/2013  FINDINGS: No pathologically enlarged mediastinal, hilar or axillary lymph nodes. Heart size normal. No pericardial effusion. There are small juxta diaphragmatic lymph nodes.  A highly loculated  collection of pleural fluid and air is seen on the right with slight pleural thickening. Largest collection of fluid is seen along the right major fissure. There is associated compressive atelectasis in the adjacent portions of the right lung. A nodule in the subpleural left upper lobe measures 9 x 10 mm (image 29). Airway is unremarkable.  Incidental imaging of the upper abdomen suggests slight irregularity of the left hepatic lobe margin. Scattered sub cm low-attenuation lesions in the liver are too small  to characterize. Visualized portions of the left adrenal gland, left kidney, spleen, pancreas, stomach and bowel are grossly unremarkable. No worrisome lytic or sclerotic lesions.  IMPRESSION: 1. Highly loculated collection of pleural fluid and air in the right hemi thorax, indicative of empyema. Difficult to definitively exclude malignancy. 2. 10 mm left upper lobe nodule. Initial followup CT in 3 months is recommended in further evaluation, as adenocarcinoma can have this appearance. This recommendation follows the consensus statement: Guidelines for Management of Small Pulmonary Nodules Detected on CT Scans: A Statement from the [place] as published in Radiology 2005; 237:395-400. 3. These results were called by telephone at the time of interpretation on 10/01/2013 at 8:21 PM to Dr. Nat Christen , who verbally acknowledged these results. 4. Question mild cirrhosis.   Electronically Signed   By: Lorin Picket M.D.   On: 10/01/2013 20:21    Review of Systems  Constitutional: Positive for fever, chills and malaise/fatigue. Negative for weight loss.  HENT: Negative.   Eyes: Negative.   Respiratory: Positive for cough and shortness of breath. Negative for sputum production.        Right sided chest pain  Cardiovascular: Negative for orthopnea, leg swelling and PND.  Gastrointestinal: Negative.   Genitourinary: Negative.   Musculoskeletal: Negative.   Skin: Negative.   Neurological: Negative.    Endo/Heme/Allergies: Negative.   Psychiatric/Behavioral: Negative.    Blood pressure 120/66, pulse 92, temperature 99.2 F (37.3 C), temperature source Oral, resp. rate 18, height 6' 1"  (1.854 m), weight 75.615 kg (166 lb 11.2 oz), SpO2 96.00%. Physical Exam  Constitutional: He is oriented to person, place, and time. He appears well-developed and well-nourished. No distress.  HENT:  Head: Normocephalic and atraumatic.  Mouth/Throat: Oropharynx is clear and moist.  Eyes: Conjunctivae and EOM are normal. Pupils are equal, round, and reactive to light.  Neck: Normal range of motion. Neck supple. No tracheal deviation present. No thyromegaly present.  Cardiovascular: Normal rate, regular rhythm, normal heart sounds and intact distal pulses.  Exam reveals no friction rub.   No murmur heard. Respiratory: Effort normal. No respiratory distress. He exhibits no tenderness.  Decreased breath sounds over right lower lobe.  GI: Soft. Bowel sounds are normal. He exhibits no distension and no mass. There is no tenderness.  Musculoskeletal: Normal range of motion. He exhibits no edema.  Lymphadenopathy:    He has no cervical adenopathy.  Neurological: He is alert and oriented to person, place, and time. He has normal strength. No cranial nerve deficit or sensory deficit.  Skin: Skin is warm and dry.  Psychiatric: He has a normal mood and affect.    CLINICAL DATA: Shortness of breath and chest pain.  EXAM:  CT CHEST WITH CONTRAST  TECHNIQUE:  Multidetector CT imaging of the chest was performed during  intravenous contrast administration.  CONTRAST: 48m OMNIPAQUE IOHEXOL 300 MG/ML SOLN  COMPARISON: DG CHEST 2 VIEW dated 10/01/2013  FINDINGS:  No pathologically enlarged mediastinal, hilar or axillary lymph  nodes. Heart size normal. No pericardial effusion. There are small  juxta diaphragmatic lymph nodes.  A highly loculated collection of pleural fluid and air is seen on  the right with  slight pleural thickening. Largest collection of  fluid is seen along the right major fissure. There is associated  compressive atelectasis in the adjacent portions of the right lung.  A nodule in the subpleural left upper lobe measures 9 x 10 mm (image  29). Airway is unremarkable.  Incidental imaging of the  upper abdomen suggests slight irregularity  of the left hepatic lobe margin. Scattered sub cm low-attenuation  lesions in the liver are too small to characterize. Visualized  portions of the left adrenal gland, left kidney, spleen, pancreas,  stomach and bowel are grossly unremarkable. No worrisome lytic or  sclerotic lesions.  IMPRESSION:  1. Highly loculated collection of pleural fluid and air in the right  hemi thorax, indicative of empyema. Difficult to definitively  exclude malignancy.  2. 10 mm left upper lobe nodule. Initial followup CT in 3 months is  recommended in further evaluation, as adenocarcinoma can have this  appearance. This recommendation follows the consensus statement:  Guidelines for Management of Small Pulmonary Nodules Detected on CT  Scans: A Statement from the [place] as published in  Radiology 2005; 237:395-400.  3. These results were called by telephone at the time of  interpretation on 10/01/2013 at 8:21 PM to Dr. Nat Christen , who  verbally acknowledged these results.  4. Question mild cirrhosis.  Electronically Signed  By: Lorin Picket M.D.  On: 10/01/2013 20:21    Assessment/Plan:  He has a right empyema with fever, leukocytosis, chest pain and shortness of breath. He will require right VATS, possible mini-thoracotomy for drainage and decortication of the lung. I discussed the procedure with the patient and his family including alternatives, benefits, and risks, including but not limited to bleeding, infection, injury to the lung, and postop air leak. He understands and agrees to proceed. I will do tomorrow morning.  Gaye Pollack 10/02/2013, 4:34 PM

## 2013-10-03 ENCOUNTER — Encounter (HOSPITAL_COMMUNITY): Payer: Self-pay | Admitting: Anesthesiology

## 2013-10-03 ENCOUNTER — Encounter (HOSPITAL_COMMUNITY): Payer: 59 | Admitting: Anesthesiology

## 2013-10-03 ENCOUNTER — Inpatient Hospital Stay (HOSPITAL_COMMUNITY): Payer: 59 | Admitting: Anesthesiology

## 2013-10-03 ENCOUNTER — Encounter (HOSPITAL_COMMUNITY)
Admission: EM | Disposition: A | Discharge status: Home / Self Care | Payer: Self-pay | Source: Home / Self Care | Attending: Surgery

## 2013-10-03 ENCOUNTER — Inpatient Hospital Stay (HOSPITAL_COMMUNITY): Payer: 59

## 2013-10-03 DIAGNOSIS — J869 Pyothorax without fistula: Secondary | ICD-10-CM | POA: Diagnosis present

## 2013-10-03 HISTORY — PX: VIDEO ASSISTED THORACOSCOPY (VATS)/EMPYEMA: SHX6172

## 2013-10-03 LAB — GLUCOSE, CAPILLARY
GLUCOSE-CAPILLARY: 138 mg/dL — AB (ref 70–99)
GLUCOSE-CAPILLARY: 134 mg/dL — AB (ref 70–99)

## 2013-10-03 LAB — CBC
HCT: 30.8 % — ABNORMAL LOW (ref 39.0–52.0)
HEMOGLOBIN: 10.5 g/dL — AB (ref 13.0–17.0)
MCH: 25.8 pg — ABNORMAL LOW (ref 26.0–34.0)
MCHC: 34.1 g/dL (ref 30.0–36.0)
MCV: 75.7 fL — AB (ref 78.0–100.0)
Platelets: 268 10*3/uL (ref 150–400)
RBC: 4.07 MIL/uL — ABNORMAL LOW (ref 4.22–5.81)
RDW: 13.9 % (ref 11.5–15.5)
WBC: 18.9 10*3/uL — ABNORMAL HIGH (ref 4.0–10.5)

## 2013-10-03 LAB — BASIC METABOLIC PANEL
BUN: 20 mg/dL (ref 6–23)
CALCIUM: 9.1 mg/dL (ref 8.4–10.5)
CO2: 26 mEq/L (ref 19–32)
Chloride: 102 mEq/L (ref 96–112)
Creatinine, Ser: 1.52 mg/dL — ABNORMAL HIGH (ref 0.50–1.35)
GFR calc Af Amer: 57 mL/min — ABNORMAL LOW (ref >90)
GFR, EST NON AFRICAN AMERICAN: 50 mL/min — AB (ref >90)
Glucose, Bld: 117 mg/dL — ABNORMAL HIGH (ref 70–99)
Potassium: 4 mEq/L (ref 3.7–5.3)
SODIUM: 139 meq/L (ref 137–147)

## 2013-10-03 LAB — SURGICAL PCR SCREEN
MRSA, PCR: NEGATIVE
STAPHYLOCOCCUS AUREUS: NEGATIVE

## 2013-10-03 SURGERY — VIDEO ASSISTED THORACOSCOPY (VATS)/EMPYEMA
Anesthesia: General | Site: Chest | Laterality: Right

## 2013-10-03 MED ORDER — NEOSTIGMINE METHYLSULFATE 1 MG/ML IJ SOLN
INTRAMUSCULAR | Status: AC
  Filled 2013-10-03: qty 10

## 2013-10-03 MED ORDER — ARTIFICIAL TEARS OP OINT
TOPICAL_OINTMENT | OPHTHALMIC | Status: DC | PRN
  Administered 2013-10-03: 1 via OPHTHALMIC

## 2013-10-03 MED ORDER — ONDANSETRON HCL 4 MG/2ML IJ SOLN
4.0000 mg | Freq: Four times a day (QID) | INTRAMUSCULAR | Status: DC | PRN
Start: 2013-10-03 — End: 2013-10-09

## 2013-10-03 MED ORDER — ROCURONIUM BROMIDE 50 MG/5ML IV SOLN
INTRAVENOUS | Status: AC
  Filled 2013-10-03: qty 1

## 2013-10-03 MED ORDER — ARTIFICIAL TEARS OP OINT
TOPICAL_OINTMENT | OPHTHALMIC | Status: AC
  Filled 2013-10-03: qty 3.5

## 2013-10-03 MED ORDER — INSULIN ASPART 100 UNIT/ML ~~LOC~~ SOLN
0.0000 [IU] | SUBCUTANEOUS | Status: DC
  Administered 2013-10-03 – 2013-10-04 (×3): 2 [IU] via SUBCUTANEOUS

## 2013-10-03 MED ORDER — NEOSTIGMINE METHYLSULFATE 1 MG/ML IJ SOLN
INTRAMUSCULAR | Status: DC | PRN
  Administered 2013-10-03: 4 mg via INTRAVENOUS

## 2013-10-03 MED ORDER — FENTANYL CITRATE 0.05 MG/ML IJ SOLN
INTRAMUSCULAR | Status: DC | PRN
  Administered 2013-10-03: 50 ug via INTRAVENOUS
  Administered 2013-10-03: 100 ug via INTRAVENOUS
  Administered 2013-10-03 (×9): 50 ug via INTRAVENOUS

## 2013-10-03 MED ORDER — DEXTROSE-NACL 5-0.9 % IV SOLN
INTRAVENOUS | Status: DC
Start: 2013-10-03 — End: 2013-10-04
  Administered 2013-10-03 – 2013-10-04 (×2): via INTRAVENOUS

## 2013-10-03 MED ORDER — ONDANSETRON HCL 4 MG/2ML IJ SOLN
INTRAMUSCULAR | Status: DC | PRN
  Administered 2013-10-03: 4 mg via INTRAVENOUS

## 2013-10-03 MED ORDER — SENNOSIDES-DOCUSATE SODIUM 8.6-50 MG PO TABS
1.0000 | ORAL_TABLET | Freq: Every evening | ORAL | Status: DC | PRN
  Filled 2013-10-03: qty 1

## 2013-10-03 MED ORDER — FENTANYL CITRATE 0.05 MG/ML IJ SOLN
INTRAMUSCULAR | Status: AC
  Filled 2013-10-03: qty 5

## 2013-10-03 MED ORDER — SUCCINYLCHOLINE CHLORIDE 20 MG/ML IJ SOLN
INTRAMUSCULAR | Status: AC
  Filled 2013-10-03: qty 1

## 2013-10-03 MED ORDER — MIDAZOLAM HCL 2 MG/2ML IJ SOLN
INTRAMUSCULAR | Status: AC
  Filled 2013-10-03: qty 2

## 2013-10-03 MED ORDER — ONDANSETRON HCL 4 MG/2ML IJ SOLN
INTRAMUSCULAR | Status: AC
  Filled 2013-10-03: qty 2

## 2013-10-03 MED ORDER — PROPOFOL 10 MG/ML IV BOLUS
INTRAVENOUS | Status: DC | PRN
  Administered 2013-10-03: 200 mg via INTRAVENOUS
  Administered 2013-10-03: 50 mg via INTRAVENOUS

## 2013-10-03 MED ORDER — SODIUM CHLORIDE 0.9 % IJ SOLN
9.0000 mL | INTRAMUSCULAR | Status: DC | PRN
Start: 2013-10-03 — End: 2013-10-08

## 2013-10-03 MED ORDER — FENTANYL 10 MCG/ML IV SOLN
INTRAVENOUS | Status: DC
  Administered 2013-10-03: 195 ug via INTRAVENOUS
  Administered 2013-10-03 (×2): via INTRAVENOUS
  Administered 2013-10-03: 270 ug via INTRAVENOUS
  Administered 2013-10-04: 45 ug via INTRAVENOUS
  Administered 2013-10-04: 30 ug via INTRAVENOUS
  Administered 2013-10-04: 75 ug via INTRAVENOUS
  Administered 2013-10-04: 295 ug via INTRAVENOUS
  Administered 2013-10-04 – 2013-10-05 (×3): 15 ug via INTRAVENOUS
  Administered 2013-10-05: 45 ug via INTRAVENOUS
  Administered 2013-10-05: 15 ug via INTRAVENOUS
  Administered 2013-10-06: 45 ug via INTRAVENOUS
  Administered 2013-10-06 (×2): 15 ug via INTRAVENOUS
  Administered 2013-10-06: 45 ug via INTRAVENOUS
  Administered 2013-10-06: 10 ug via INTRAVENOUS
  Administered 2013-10-07: 15 ug via INTRAVENOUS
  Administered 2013-10-07 (×2): 45 ug via INTRAVENOUS
  Filled 2013-10-03 (×4): qty 50

## 2013-10-03 MED ORDER — ROCURONIUM BROMIDE 100 MG/10ML IV SOLN
INTRAVENOUS | Status: DC | PRN
  Administered 2013-10-03: 50 mg via INTRAVENOUS
  Administered 2013-10-03 (×2): 10 mg via INTRAVENOUS

## 2013-10-03 MED ORDER — LACTATED RINGERS IV SOLN
INTRAVENOUS | Status: DC | PRN
  Administered 2013-10-03 (×2): via INTRAVENOUS

## 2013-10-03 MED ORDER — POTASSIUM CHLORIDE 10 MEQ/50ML IV SOLN
10.0000 meq | Freq: Every day | INTRAVENOUS | Status: DC | PRN
  Administered 2013-10-07 (×3): 10 meq via INTRAVENOUS
  Filled 2013-10-03: qty 50

## 2013-10-03 MED ORDER — 0.9 % SODIUM CHLORIDE (POUR BTL) OPTIME
TOPICAL | Status: DC | PRN
  Administered 2013-10-03: 2000 mL

## 2013-10-03 MED ORDER — METOCLOPRAMIDE HCL 5 MG/ML IJ SOLN
10.0000 mg | Freq: Four times a day (QID) | INTRAMUSCULAR | Status: DC
  Administered 2013-10-03 – 2013-10-08 (×20): 10 mg via INTRAVENOUS
  Filled 2013-10-03 (×28): qty 2

## 2013-10-03 MED ORDER — STERILE WATER FOR INJECTION IJ SOLN
INTRAMUSCULAR | Status: AC
  Filled 2013-10-03: qty 10

## 2013-10-03 MED ORDER — TRAMADOL HCL 50 MG PO TABS
50.0000 mg | ORAL_TABLET | Freq: Four times a day (QID) | ORAL | Status: DC | PRN
  Administered 2013-10-03 – 2013-10-05 (×5): 50 mg via ORAL
  Filled 2013-10-03 (×5): qty 1

## 2013-10-03 MED ORDER — EPHEDRINE SULFATE 50 MG/ML IJ SOLN
INTRAMUSCULAR | Status: AC
  Filled 2013-10-03: qty 1

## 2013-10-03 MED ORDER — MIDAZOLAM HCL 5 MG/5ML IJ SOLN
INTRAMUSCULAR | Status: DC | PRN
  Administered 2013-10-03 (×2): 1 mg via INTRAVENOUS

## 2013-10-03 MED ORDER — METOCLOPRAMIDE HCL 5 MG/ML IJ SOLN
10.0000 mg | Freq: Four times a day (QID) | INTRAMUSCULAR | Status: DC
  Administered 2013-10-03: 10 mg via INTRAVENOUS
  Filled 2013-10-03: qty 2

## 2013-10-03 MED ORDER — GLYCOPYRROLATE 0.2 MG/ML IJ SOLN
INTRAMUSCULAR | Status: DC | PRN
  Administered 2013-10-03: .8 mg via INTRAVENOUS

## 2013-10-03 MED ORDER — DIPHENHYDRAMINE HCL 12.5 MG/5ML PO ELIX
12.5000 mg | ORAL_SOLUTION | Freq: Four times a day (QID) | ORAL | Status: DC | PRN
  Filled 2013-10-03: qty 5

## 2013-10-03 MED ORDER — LACTATED RINGERS IV SOLN
INTRAVENOUS | Status: DC | PRN
  Administered 2013-10-03: 09:00:00 via INTRAVENOUS

## 2013-10-03 MED ORDER — FENTANYL CITRATE 0.05 MG/ML IJ SOLN
25.0000 ug | INTRAMUSCULAR | Status: DC | PRN
  Administered 2013-10-03 (×2): 50 ug via INTRAVENOUS

## 2013-10-03 MED ORDER — ACETAMINOPHEN 500 MG PO TABS
1000.0000 mg | ORAL_TABLET | Freq: Four times a day (QID) | ORAL | Status: AC
  Administered 2013-10-03 – 2013-10-04 (×3): 1000 mg via ORAL
  Filled 2013-10-03 (×2): qty 2

## 2013-10-03 MED ORDER — BISACODYL 5 MG PO TBEC
10.0000 mg | DELAYED_RELEASE_TABLET | Freq: Every day | ORAL | Status: DC
  Administered 2013-10-04 – 2013-10-06 (×3): 10 mg via ORAL
  Filled 2013-10-03 (×5): qty 2

## 2013-10-03 MED ORDER — DIPHENHYDRAMINE HCL 50 MG/ML IJ SOLN: 12.5000 mg | Freq: Four times a day (QID) | INTRAMUSCULAR | Status: DC | PRN

## 2013-10-03 MED ORDER — PROPOFOL 10 MG/ML IV BOLUS
INTRAVENOUS | Status: AC
  Filled 2013-10-03: qty 20

## 2013-10-03 MED ORDER — GLYCOPYRROLATE 0.2 MG/ML IJ SOLN
INTRAMUSCULAR | Status: AC
  Filled 2013-10-03: qty 5

## 2013-10-03 MED ORDER — LIDOCAINE HCL (CARDIAC) 20 MG/ML IV SOLN
INTRAVENOUS | Status: AC
  Filled 2013-10-03: qty 5

## 2013-10-03 MED ORDER — ACETAMINOPHEN 160 MG/5ML PO SOLN: 1000.0000 mg | Freq: Four times a day (QID) | ORAL | Status: AC

## 2013-10-03 MED ORDER — FENTANYL CITRATE 0.05 MG/ML IJ SOLN
INTRAMUSCULAR | Status: AC
  Administered 2013-10-03: 50 ug via INTRAVENOUS
  Filled 2013-10-03: qty 2

## 2013-10-03 MED ORDER — NALOXONE HCL 0.4 MG/ML IJ SOLN: 0.4000 mg | INTRAMUSCULAR | Status: DC | PRN

## 2013-10-03 MED ORDER — LEVALBUTEROL HCL 0.63 MG/3ML IN NEBU
0.6300 mg | INHALATION_SOLUTION | Freq: Four times a day (QID) | RESPIRATORY_TRACT | Status: DC
  Administered 2013-10-03 – 2013-10-05 (×8): 0.63 mg via RESPIRATORY_TRACT
  Filled 2013-10-03 (×19): qty 3

## 2013-10-03 MED ORDER — LIDOCAINE HCL (CARDIAC) 20 MG/ML IV SOLN
INTRAVENOUS | Status: DC | PRN
  Administered 2013-10-03: 50 mg via INTRAVENOUS

## 2013-10-03 MED ORDER — ONDANSETRON HCL 4 MG/2ML IJ SOLN: 4.0000 mg | Freq: Four times a day (QID) | INTRAMUSCULAR | Status: DC | PRN

## 2013-10-03 SURGICAL SUPPLY — 68 items
APPLICATOR TIP COSEAL (VASCULAR PRODUCTS) IMPLANT
APPLICATOR TIP EXT COSEAL (VASCULAR PRODUCTS) IMPLANT
BIT DRILL 7/64X5 DISP (BIT) ×2 IMPLANT
CANISTER SUCTION 2500CC (MISCELLANEOUS) ×2 IMPLANT
CATH KIT ON Q 5IN SLV (PAIN MANAGEMENT) IMPLANT
CATH THORACIC 28FR (CATHETERS) IMPLANT
CATH THORACIC 36FR (CATHETERS) IMPLANT
CATH THORACIC 36FR RT ANG (CATHETERS) IMPLANT
CLEANER TIP ELECTROSURG 2X2 (MISCELLANEOUS) ×2 IMPLANT
CLIP TI MEDIUM 6 (CLIP) ×2 IMPLANT
CONN Y 3/8X3/8X3/8  BEN (MISCELLANEOUS) ×1
CONN Y 3/8X3/8X3/8 BEN (MISCELLANEOUS) ×1 IMPLANT
CONT SPEC 4OZ CLIKSEAL STRL BL (MISCELLANEOUS) ×4 IMPLANT
DERMABOND ADVANCED (GAUZE/BANDAGES/DRESSINGS)
DERMABOND ADVANCED .7 DNX12 (GAUZE/BANDAGES/DRESSINGS) IMPLANT
DRAIN CHANNEL 32F RND 10.7 FF (WOUND CARE) ×6 IMPLANT
DRAPE LAPAROSCOPIC ABDOMINAL (DRAPES) ×2 IMPLANT
DRAPE WARM FLUID 44X44 (DRAPE) ×2 IMPLANT
ELECT REM PT RETURN 9FT ADLT (ELECTROSURGICAL) ×2
ELECTRODE REM PT RTRN 9FT ADLT (ELECTROSURGICAL) ×1 IMPLANT
GLOVE BIO SURGEON STRL SZ 6 (GLOVE) ×2 IMPLANT
GLOVE BIO SURGEON STRL SZ 6.5 (GLOVE) ×10 IMPLANT
GLOVE BIOGEL PI IND STRL 6 (GLOVE) ×1 IMPLANT
GLOVE BIOGEL PI INDICATOR 6 (GLOVE) ×1
GLOVE ECLIPSE 6.5 STRL STRAW (GLOVE) ×2 IMPLANT
GLOVE EUDERMIC 7 POWDERFREE (GLOVE) ×4 IMPLANT
GOWN STRL REUS W/ TWL LRG LVL3 (GOWN DISPOSABLE) ×4 IMPLANT
GOWN STRL REUS W/ TWL XL LVL3 (GOWN DISPOSABLE) ×1 IMPLANT
GOWN STRL REUS W/TWL LRG LVL3 (GOWN DISPOSABLE) ×4
GOWN STRL REUS W/TWL XL LVL3 (GOWN DISPOSABLE) ×1
KIT BASIN OR (CUSTOM PROCEDURE TRAY) ×2 IMPLANT
KIT ROOM TURNOVER OR (KITS) ×2 IMPLANT
KIT SUCTION CATH 14FR (SUCTIONS) ×2 IMPLANT
NS IRRIG 1000ML POUR BTL (IV SOLUTION) ×4 IMPLANT
PACK CHEST (CUSTOM PROCEDURE TRAY) ×2 IMPLANT
PAD ARMBOARD 7.5X6 YLW CONV (MISCELLANEOUS) ×4 IMPLANT
SEALANT SURG COSEAL 4ML (VASCULAR PRODUCTS) IMPLANT
SEALANT SURG COSEAL 8ML (VASCULAR PRODUCTS) IMPLANT
SOLUTION ANTI FOG 6CC (MISCELLANEOUS) IMPLANT
SPONGE GAUZE 4X4 12PLY (GAUZE/BANDAGES/DRESSINGS) ×2 IMPLANT
SPONGE GAUZE 4X4 12PLY STER LF (GAUZE/BANDAGES/DRESSINGS) ×2 IMPLANT
STAPLER VISISTAT 35W (STAPLE) ×2 IMPLANT
SUT PROLENE 3 0 SH DA (SUTURE) IMPLANT
SUT PROLENE 4 0 RB 1 (SUTURE)
SUT PROLENE 4-0 RB1 .5 CRCL 36 (SUTURE) IMPLANT
SUT SILK  1 MH (SUTURE) ×3
SUT SILK 1 MH (SUTURE) ×3 IMPLANT
SUT SILK 2 0SH CR/8 30 (SUTURE) IMPLANT
SUT SILK 3 0SH CR/8 30 (SUTURE) IMPLANT
SUT VIC AB 1 CTX 18 (SUTURE) IMPLANT
SUT VIC AB 1 CTX 36 (SUTURE) ×1
SUT VIC AB 1 CTX36XBRD ANBCTR (SUTURE) ×1 IMPLANT
SUT VIC AB 2-0 CTX 36 (SUTURE) ×2 IMPLANT
SUT VIC AB 2-0 UR6 27 (SUTURE) IMPLANT
SUT VIC AB 3-0 MH 27 (SUTURE) IMPLANT
SUT VIC AB 3-0 X1 27 (SUTURE) ×2 IMPLANT
SUT VICRYL 2 TP 1 (SUTURE) ×2 IMPLANT
SWAB COLLECTION DEVICE MRSA (MISCELLANEOUS) ×2 IMPLANT
SYSTEM SAHARA CHEST DRAIN ATS (WOUND CARE) ×2 IMPLANT
TAPE CLOTH 4X10 WHT NS (GAUZE/BANDAGES/DRESSINGS) ×2 IMPLANT
TAPE CLOTH SURG 4X10 WHT LF (GAUZE/BANDAGES/DRESSINGS) ×2 IMPLANT
TIP APPLICATOR SPRAY EXTEND 16 (VASCULAR PRODUCTS) IMPLANT
TOWEL OR 17X24 6PK STRL BLUE (TOWEL DISPOSABLE) ×2 IMPLANT
TOWEL OR 17X26 10 PK STRL BLUE (TOWEL DISPOSABLE) ×4 IMPLANT
TRAP SPECIMEN MUCOUS 40CC (MISCELLANEOUS) IMPLANT
TRAY FOLEY CATH 14FRSI W/METER (CATHETERS) ×2 IMPLANT
TUBE ANAEROBIC SPECIMEN COL (MISCELLANEOUS) ×2 IMPLANT
WATER STERILE IRR 1000ML POUR (IV SOLUTION) ×4 IMPLANT

## 2013-10-03 NOTE — Brief Op Note (Signed)
10/01/2013 - 10/03/2013  11:22 AM  PATIENT:  Darrell Woodard  58 y.o. male  PRE-OPERATIVE DIAGNOSIS:  empyema  POST-OPERATIVE DIAGNOSIS:  empyema  PROCEDURE:  Procedure(s): Right thoracotomy, drainage of empyema and decortication of right lung   Findings: Complicated empyema involving entire pleural space and all 3 lobes covered with fibrinous peel. Large amount of pus in the pleural space.  SURGEON:  Surgeon(s) and Role:    * Gaye Pollack, MD - Primary  PHYSICIAN ASSISTANT: Lars Pinks, PA-C    ANESTHESIA:   general  EBL:  Total I/O In: 1400 [I.V.:1400] Out: 0076 [Urine:525; Other:200; Blood:300]  BLOOD ADMINISTERED:none  DRAINS: (3 30F) Blake drain(s) in the right pleural space   LOCAL MEDICATIONS USED:  NONE  SPECIMEN:  Pus and fibrinous debri from right pleural space for routine and anaerobic culture  DISPOSITION OF SPECIMEN:  micro  COUNTS:  YES  TOURNIQUET:  * No tourniquets in log *  DICTATION: .Note written in EPIC  PLAN OF CARE: Admit to inpatient   PATIENT DISPOSITION:  PACU - hemodynamically stable.   Delay start of Pharmacological VTE agent (>24hrs) due to surgical blood loss or risk of bleeding: yes

## 2013-10-03 NOTE — Anesthesia Procedure Notes (Addendum)
Procedure Name: Intubation Date/Time: 10/03/2013 9:23 AM Performed by: Neldon Newport Pre-anesthesia Checklist: Patient identified, Timeout performed, Emergency Drugs available, Suction available and Patient being monitored Patient Re-evaluated:Patient Re-evaluated prior to inductionOxygen Delivery Method: Circle system utilized Preoxygenation: Pre-oxygenation with 100% oxygen Intubation Type: IV induction Ventilation: Mask ventilation without difficulty Laryngoscope Size: Mac and 4 Grade View: Grade II Tube type: Oral Endobronchial tube: Left, Double lumen EBT, EBT position confirmed by auscultation and EBT position confirmed by fiberoptic bronchoscope and 39 Fr Number of attempts: 1 Airway Equipment and Method: Fiberoptic brochoscope Placement Confirmation: ETT inserted through vocal cords under direct vision,  positive ETCO2 and breath sounds checked- equal and bilateral Tube secured with: Tape Dental Injury: Teeth and Oropharynx as per pre-operative assessment     RIJ CVP Dual Lumen: [id number]: The patient was identified and consent obtained.  TO was performed, and full barrier precautions were used.  The skin was anesthetized with lidocaine.  Once the vein was located with the 22 ga. needle using ultrasound guidance , the wire was inserted into the vein.  The wire location was confirmed with ultrasound.  The tissue was dilated and the catheter was carefully inserted, then sutured in place. A dressing was applied. The patient tolerated the procedure well.   CE

## 2013-10-03 NOTE — Anesthesia Preprocedure Evaluation (Addendum)
Anesthesia Evaluation  Patient identified by MRN, date of birth, ID band Patient awake    Reviewed: Allergy & Precautions, H&P , NPO status , Patient's Chart, lab work & pertinent test results  Airway Mallampati: II      Dental  (+) Teeth Intact, Dental Advidsory Given   Pulmonary asthma , pneumonia -,    + decreased breath sounds      Cardiovascular negative cardio ROS  Rhythm:Regular Rate:Normal     Neuro/Psych    GI/Hepatic negative GI ROS, Neg liver ROS,   Endo/Other    Renal/GU Renal disease     Musculoskeletal   Abdominal   Peds  Hematology   Anesthesia Other Findings   Reproductive/Obstetrics                         Anesthesia Physical Anesthesia Plan  ASA: III  Anesthesia Plan: General   Post-op Pain Management:    Induction: Intravenous  Airway Management Planned: Double Lumen EBT  Additional Equipment:   Intra-op Plan:   Post-operative Plan: Possible Post-op intubation/ventilation  Informed Consent: I have reviewed the patients History and Physical, chart, labs and discussed the procedure including the risks, benefits and alternatives for the proposed anesthesia with the patient or authorized representative who has indicated his/her understanding and acceptance.   Dental advisory given and Dental Advisory Given  Plan Discussed with: CRNA, Anesthesiologist and Surgeon  Anesthesia Plan Comments:        Anesthesia Quick Evaluation

## 2013-10-03 NOTE — Transfer of Care (Signed)
Immediate Anesthesia Transfer of Care Note  Patient: Darrell Woodard  Procedure(s) Performed: Procedure(s): VIDEO ASSISTED THORACOSCOPY (VATS)/EMPYEMA (Right)  Patient Location: PACU  Anesthesia Type:General  Level of Consciousness: awake, alert  and oriented  Airway & Oxygen Therapy: Patient Spontanous Breathing and Patient connected to face mask oxygen  Post-op Assessment: Report given to PACU RN, Post -op Vital signs reviewed and stable and Patient moving all extremities X 4  Post vital signs: Reviewed and stable  Complications: No apparent anesthesia complications

## 2013-10-03 NOTE — Anesthesia Postprocedure Evaluation (Signed)
  Anesthesia Post-op Note  Patient: Darrell Woodard  Procedure(s) Performed: Procedure(s): VIDEO ASSISTED THORACOSCOPY (VATS)/EMPYEMA (Right)  Patient Location: PACU  Anesthesia Type:General  Level of Consciousness: awake  Airway and Oxygen Therapy: Patient Spontanous Breathing  Post-op Pain: mild  Post-op Assessment: Post-op Vital signs reviewed  Post-op Vital Signs: Reviewed  Complications: No apparent anesthesia complications

## 2013-10-03 NOTE — Progress Notes (Signed)
[facility] PROGRESS NOTE  [place] KGM:[id number] DOB: September 02, 1955 DOA: 10/01/2013 PCP: Lonzo Cloud, MD  Assessment/Plan: #1 right lung empyema: Patient stable; low grade fever appreciated. -WBC's remains elevated -Dr. Cyndia Bent (CVTS) has seen patient and plan is for VATS today (3/01). Will follow rec's -continue broad spectrum antibiotics  #2 dehydration with mild acute renal failure: slowly improving. -will continue IVF's  #3 microcytic anemia: -will follow anemia panel results  #4 history of asthma: Appears to be stable. Albuterol as needed.  -continue pulmicort -no wheezing  #5 left upper lung nodule: This will require outpatient followup.  #6: leukocytosis and low grade fever: due to #1. Continue current antibiotic treatment. -plan is for VATS today  DVT: SCD's  Code Status: Full Family Communication: daughter at bedside Disposition Plan: to be determine    Consultants:  CVTS (Dr. Cyndia Bent)  Procedures:  See below for x-ray reports   Antibiotics:  vanc and cefepime 2/27  HPI/Subjective: Low grade fever still appreciated; WBC;s has remained elevated and patient still with pleuritic CP.   Objective: Filed Vitals:   10/03/13 0800  BP: 131/64  Pulse: 92  Temp: 99.3 F (37.4 C)  Resp:     Intake/Output Summary (Last 24 hours) at 10/03/13 1044 Last data filed at 10/03/13 0945  Gross per 24 hour  Intake 3743.33 ml  Output    675 ml  Net 3068.33 ml   Filed Weights   10/01/13 1349 10/02/13 0018 10/03/13 0447  Weight: 77.111 kg (170 lb) 75.615 kg (166 lb 11.2 oz) 76.885 kg (169 lb 8 oz)    Exam:   General:  Low grade temp, complaining of pleuritic CP. SOB slightly improved/stable.  Cardiovascular: S1 and S2, no rubs or gallops  Respiratory: decrease BS mid and lower sections on right lungs; positive slight exp wheezing. Otherwise clear  Abdomen: soft, NT, ND, positive BS  Musculoskeletal: no joint swelling, FROM  Data Reviewed: Basic  Metabolic Panel:  Recent Labs Lab 10/01/13 1817 10/02/13 0134 10/03/13 0544  NA 136* 135* 139  K 3.2* 3.7 4.0  CL 95* 95* 102  CO2 27 27 26   GLUCOSE 154* 153* 117*  BUN 28* 23 20  CREATININE 1.66* 1.59* 1.52*  CALCIUM 9.6 9.1 9.1   Liver Function Tests:  Recent Labs Lab 10/02/13 0134  AST 33  ALT 32  ALKPHOS 107  BILITOT 1.0  PROT 7.0  ALBUMIN 2.3*   CBC:  Recent Labs Lab 10/01/13 1817 10/02/13 0134 10/03/13 0544  WBC 17.2* 17.9* 18.9*  HGB 12.3* 11.5* 10.5*  HCT 36.6* 33.3* 30.8*  MCV 75.6* 74.3* 75.7*  PLT 274 246 268   Cardiac Enzymes:  Recent Labs Lab 10/01/13 1817  TROPONINI <0.30    Studies: Dg Chest 2 View  10/01/2013   CLINICAL DATA:  Shortness of Breath  EXAM: CHEST  2 VIEW  COMPARISON:  None.  FINDINGS: There is infiltrate/ consolidation in right lower lobe with air bronchogram highly suspicious for pneumonia. Probable small right pleural effusion. There is a nodular density in left midlung laterally measures 1.2 cm. Further correlation with CT scan of the chest is recommended to exclude a lung mass.  IMPRESSION: There is infiltrate/ consolidation in right lower lobe with air bronchogram highly suspicious for pneumonia. Probable small right pleural effusion. There is a nodular density in left midlung laterally measures 1.2 cm. Further correlation with CT scan of the chest is recommended to exclude a lung mass.   Electronically Signed   By: Orlean Bradford.D.  On: 10/01/2013 18:53   Ct Chest W Contrast  10/01/2013   CLINICAL DATA:  Shortness of breath and chest pain.  EXAM: CT CHEST WITH CONTRAST  TECHNIQUE: Multidetector CT imaging of the chest was performed during intravenous contrast administration.  CONTRAST:  5mL OMNIPAQUE IOHEXOL 300 MG/ML  SOLN  COMPARISON:  DG CHEST 2 VIEW dated 10/01/2013  FINDINGS: No pathologically enlarged mediastinal, hilar or axillary lymph nodes. Heart size normal. No pericardial effusion. There are small juxta diaphragmatic  lymph nodes.  A highly loculated collection of pleural fluid and air is seen on the right with slight pleural thickening. Largest collection of fluid is seen along the right major fissure. There is associated compressive atelectasis in the adjacent portions of the right lung. A nodule in the subpleural left upper lobe measures 9 x 10 mm (image 29). Airway is unremarkable.  Incidental imaging of the upper abdomen suggests slight irregularity of the left hepatic lobe margin. Scattered sub cm low-attenuation lesions in the liver are too small to characterize. Visualized portions of the left adrenal gland, left kidney, spleen, pancreas, stomach and bowel are grossly unremarkable. No worrisome lytic or sclerotic lesions.  IMPRESSION: 1. Highly loculated collection of pleural fluid and air in the right hemi thorax, indicative of empyema. Difficult to definitively exclude malignancy. 2. 10 mm left upper lobe nodule. Initial followup CT in 3 months is recommended in further evaluation, as adenocarcinoma can have this appearance. This recommendation follows the consensus statement: Guidelines for Management of Small Pulmonary Nodules Detected on CT Scans: A Statement from the [place] as published in Radiology 2005; 237:395-400. 3. These results were called by telephone at the time of interpretation on 10/01/2013 at 8:21 PM to Dr. Nat Christen , who verbally acknowledged these results. 4. Question mild cirrhosis.   Electronically Signed   By: Lorin Picket M.D.   On: 10/01/2013 20:21    Scheduled Meds: . [MAR HOLD] budesonide (PULMICORT) nebulizer solution  0.25 mg Nebulization BID  . [MAR HOLD] piperacillin-tazobactam (ZOSYN)  IV  3.375 g Intravenous Q8H  . [facility] HOLD] pneumococcal 23 valent vaccine  0.5 mL Intramuscular Tomorrow-1000  . [MAR HOLD] sodium chloride  3 mL Intravenous Q12H  . [facility] HOLD] vancomycin  750 mg Intravenous Q12H   Continuous Infusions: . sodium chloride 100 mL/hr at 10/03/13 0605     Principal Problem:   Empyema Active Problems:   History of asthma   Dehydration   ARF (acute renal failure)   Microcytic anemia    Time spent: >30 minutes   Vernadine Coombs  [facility] Pager [phone]. If 7PM-7AM, please contact night-coverage at [email or web address], password [facility] 10/03/2013, 10:44 AM  LOS: 2 days

## 2013-10-03 NOTE — Preoperative (Signed)
Beta Blockers   Reason not to administer Beta Blockers:Not Applicable 

## 2013-10-04 ENCOUNTER — Encounter (HOSPITAL_COMMUNITY): Payer: Self-pay | Admitting: Surgery

## 2013-10-04 ENCOUNTER — Inpatient Hospital Stay (HOSPITAL_COMMUNITY): Payer: 59

## 2013-10-04 LAB — POCT I-STAT 3, ART BLOOD GAS (G3+)
Acid-Base Excess: 3 mmol/L — ABNORMAL HIGH (ref 0.0–2.0)
Bicarbonate: 28.8 mEq/L — ABNORMAL HIGH (ref 20.0–24.0)
O2 Saturation: 97 %
Patient temperature: 97.9
TCO2: 30 mmol/L (ref 0–100)
pCO2 arterial: 46.7 mmHg — ABNORMAL HIGH (ref 35.0–45.0)
pH, Arterial: 7.396 (ref 7.350–7.450)
pO2, Arterial: 88 mmHg (ref 80.0–100.0)

## 2013-10-04 LAB — BASIC METABOLIC PANEL
BUN: 16 mg/dL (ref 6–23)
CALCIUM: 7.9 mg/dL — AB (ref 8.4–10.5)
CHLORIDE: 100 meq/L (ref 96–112)
CO2: 24 meq/L (ref 19–32)
Creatinine, Ser: 1.24 mg/dL (ref 0.50–1.35)
GFR calc non Af Amer: 63 mL/min — ABNORMAL LOW (ref >90)
GFR, EST AFRICAN AMERICAN: 73 mL/min — AB (ref >90)
Glucose, Bld: 110 mg/dL — ABNORMAL HIGH (ref 70–99)
Potassium: 3.8 mEq/L (ref 3.7–5.3)
SODIUM: 135 meq/L — AB (ref 137–147)

## 2013-10-04 LAB — GLUCOSE, CAPILLARY
GLUCOSE-CAPILLARY: 102 mg/dL — AB (ref 70–99)
GLUCOSE-CAPILLARY: 131 mg/dL — AB (ref 70–99)
GLUCOSE-CAPILLARY: 141 mg/dL — AB (ref 70–99)
Glucose-Capillary: 107 mg/dL — ABNORMAL HIGH (ref 70–99)
Glucose-Capillary: 107 mg/dL — ABNORMAL HIGH (ref 70–99)
Glucose-Capillary: 115 mg/dL — ABNORMAL HIGH (ref 70–99)

## 2013-10-04 LAB — CBC
HCT: 29.4 % — ABNORMAL LOW (ref 39.0–52.0)
Hemoglobin: 9.8 g/dL — ABNORMAL LOW (ref 13.0–17.0)
MCH: 25.5 pg — AB (ref 26.0–34.0)
MCHC: 33.3 g/dL (ref 30.0–36.0)
MCV: 76.4 fL — AB (ref 78.0–100.0)
Platelets: 255 10*3/uL (ref 150–400)
RBC: 3.85 MIL/uL — ABNORMAL LOW (ref 4.22–5.81)
RDW: 14.3 % (ref 11.5–15.5)
WBC: 22.2 10*3/uL — AB (ref 4.0–10.5)

## 2013-10-04 LAB — LEGIONELLA ANTIGEN, URINE: Legionella Antigen, Urine: NEGATIVE

## 2013-10-04 MED ORDER — SODIUM CHLORIDE 0.9 % IV SOLN
INTRAVENOUS | Status: DC
  Administered 2013-10-04: 09:00:00 via INTRAVENOUS
  Administered 2013-10-06: 20 mL/h via INTRAVENOUS

## 2013-10-04 MED ORDER — FERROUS SULFATE 325 (65 FE) MG PO TABS
325.0000 mg | ORAL_TABLET | Freq: Three times a day (TID) | ORAL | Status: DC
  Administered 2013-10-04 – 2013-10-08 (×13): 325 mg via ORAL
  Filled 2013-10-04 (×18): qty 1

## 2013-10-04 NOTE — Progress Notes (Signed)
UR Completed.  Vergie Living [id number] 10/04/2013

## 2013-10-04 NOTE — Progress Notes (Addendum)
[facility] DAILY ICU PROGRESS NOTE                   [employer]            [place] [address]          [phone]   1 Day Post-Op Procedure(s) (LRB): VIDEO ASSISTED THORACOSCOPY (VATS)/EMPYEMA (Right)  Total Length of Stay:  LOS: 3 days   Subjective: Some discomfort  Objective: Vital signs in last 24 hours: Temp:  [97.8 F (36.6 C)-100 F (37.8 C)] 98.1 F (36.7 C) (03/02 0717) Pulse Rate:  [58-107] 58 (03/02 0600) Cardiac Rhythm:  [-] Sinus bradycardia (03/02 0600) Resp:  [6-26] 12 (03/02 0600) BP: (101-145)/(64-87) 121/66 mmHg (03/02 0600) SpO2:  [94 %-100 %] 99 % (03/02 0600) Arterial Line BP: (117-131)/(61-70) 118/61 mmHg (03/01 1300) FiO2 (%):  [97 %] 97 % (03/01 1545) Weight:  [174 lb 13.2 oz (79.3 kg)] 174 lb 13.2 oz (79.3 kg) (03/02 0627)  Filed Weights   10/02/13 0018 10/03/13 0447 10/04/13 0627  Weight: 166 lb 11.2 oz (75.615 kg) 169 lb 8 oz (76.885 kg) 174 lb 13.2 oz (79.3 kg)    Weight change: 5 lb 5.2 oz (2.415 kg)   Hemodynamic parameters for last 24 hours:    Intake/Output from previous day: 03/01 0701 - 03/02 0700 In: 4352 [I.V.:4302; IV Piggyback:50] Out: 2420 [Urine:1560; Blood:300; Chest Tube:360]  Intake/Output this shift:    Current Meds: Scheduled Meds: . acetaminophen  1,000 mg Oral 4 times per day   Or  . acetaminophen (TYLENOL) oral liquid 160 mg/5 mL  1,000 mg Oral 4 times per day  . bisacodyl  10 mg Oral Daily  . fentaNYL   Intravenous 6 times per day  . insulin aspart  0-24 Units Subcutaneous 6 times per day  . levalbuterol  0.63 mg Nebulization Q6H  . metoCLOPramide (REGLAN) injection  10 mg Intravenous 4 times per day  . piperacillin-tazobactam (ZOSYN)  IV  3.375 g Intravenous Q8H  . vancomycin  750 mg Intravenous Q12H   Continuous Infusions: . dextrose 5 % and 0.9% NaCl 125 mL/hr at 10/04/13 0600   PRN Meds:.diphenhydrAMINE, diphenhydrAMINE, naloxone, ondansetron (ZOFRAN) IV, ondansetron (ZOFRAN) IV,  potassium chloride, senna-docusate, sodium chloride, traMADol  General appearance: alert, cooperative, fatigued and no distress Heart: regular rate and rhythm Lungs: dim right > left base Abdomen: soft, nontender Extremities: no edema Wound: dressings CDI  Lab Results: CBC: Recent Labs  10/03/13 0544 10/04/13 0355  WBC 18.9* 22.2*  HGB 10.5* 9.8*  HCT 30.8* 29.4*  PLT 268 255   BMET:  Recent Labs  10/03/13 0544 10/04/13 0355  NA 139 135*  K 4.0 3.8  CL 102 100  CO2 26 24  GLUCOSE 117* 110*  BUN 20 16  CREATININE 1.52* 1.24  CALCIUM 9.1 7.9*    ABG    Component Value Date/Time   PHART 7.396 10/04/2013 0407   PCO2ART 46.7* 10/04/2013 0407   PO2ART 88.0 10/04/2013 0407   HCO3 28.8* 10/04/2013 0407   TCO2 30 10/04/2013 0407   O2SAT 97.0 10/04/2013 0407   Iron/TIBC/Ferritin    Component Value Date/Time   IRON 30* 10/02/2013 0134   TIBC 147* 10/02/2013 0134   FERRITIN 1387* 10/02/2013 0134     PT/INR: No results found for this basename: LABPROT, INR,  in the last 72 hours Radiology: Dg Chest Portable 1 View  10/03/2013   CLINICAL DATA:  Postop, history pneumonia, asthma  EXAM: PORTABLE CHEST -  1 VIEW  COMPARISON:  CT CHEST W/CM dated 10/01/2013; DG CHEST 2 VIEW dated 10/01/2013  FINDINGS: Grossly unchanged cardiac silhouette and mediastinal contours. Interval placement of 3 right-sided chest tubes and right jugular approach central venous catheter with tip projected over the superior cavoatrial junction. No pneumothorax. No definitive change in persistent moderate-sized right-sided pleural effusion and associated right mid and lower lung heterogeneous/consolidative opacities. Minimal linear heterogeneous opacities are seen within the left costophrenic angle and favored to represent subsegmental atelectasis. No new focal airspace opacities. No definite evidence of edema. Grossly unchanged bones.  IMPRESSION: 1. Support apparatus as above.  No pneumothorax. 2. No definitive interval  change in persistent moderate-sized right-sided effusion and associated right mid and lower lung heterogeneous / consolidative opacities worrisome for infection.   Electronically Signed   By: Sandi Mariscal M.D.   On: 10/03/2013 13:15   Chest tube - No air leak  Assessment/Plan: S/P Procedure(s) (LRB): VIDEO ASSISTED THORACOSCOPY (VATS)/EMPYEMA (Right)  1 doing well overall 2 conts zosyn/vanco 3 cont CTubes 4 push pulm toilet 5 microcytic anemia- check stool guiac      GOLD,WAYNE E 10/04/2013 7:27 AM  Chart reviewed, patient examined, agree with above. He is doing well. Gram stain of pleural peel shows few gram positive cocci in pairs. Culture pending. Continue current antibiotics.

## 2013-10-04 NOTE — Progress Notes (Signed)
[facility] PROGRESS NOTE  Jabri Blancett UKG:[id number] DOB: Aug 07, 1955 DOA: 10/01/2013 PCP: Lonzo Cloud, MD  Assessment/Plan: #1 right lung empyema: Patient stable; low grade fever appreciated. -WBC's remains elevated, but no fever -s/p VATS and CT by Dr. Cyndia Bent (CVTS) on 3/01. Will follow rec's -continue broad spectrum antibiotics -follow cultures and continue pulmonary toiletry   #2 dehydration with mild acute renal failure:  -will continue IVF's, but will start to decrease as renal function 1.2 now  #3 microcytic anemia/ABLA: -combination of AOCD (ferritin 1387) -normal B12 and Folate -iron mildly decrease -component of ABLA from surgery; Hgb 9.7 -follow FOBT (but mo history of GIB in the past); start ferrous sulfate -normal colonoscopy 4 years ago  -no transfusion needed at this moment    #4 history of asthma: Appears to be stable. xopenex as needed.  -no wheezing  #5 left upper lung nodule: This will require outpatient followup.  #6: leukocytosis and low grade fever: due to #1. Continue current antibiotic treatment. -s/p VATS on 10/03/13 -no further fever  DVT: SCD's  Code Status: Full Family Communication: daughter at bedside Disposition Plan: to be determine    Consultants:  CVTS (Dr. Cyndia Bent)  Procedures:  See below for x-ray reports   Antibiotics:  vanc 2/27  Cefepime 2/27>>3/01  Zosyn 3/01  HPI/Subjective: No fever; WBC;s has remained elevated and patient complaining just of mild to moderate discomfort in the area where CT is placed.   Objective: Filed Vitals:   10/04/13 0800  BP: 118/70  Pulse: 65  Temp:   Resp: 15    Intake/Output Summary (Last 24 hours) at 10/04/13 0843 Last data filed at 10/04/13 0800  Gross per 24 hour  Intake 4364.5 ml  Output   2305 ml  Net 2059.5 ml   Filed Weights   10/02/13 0018 10/03/13 0447 10/04/13 0627  Weight: 75.615 kg (166 lb 11.2 oz) 76.885 kg (169 lb 8 oz) 79.3 kg (174 lb 13.2 oz)     Exam:   General:  afebrile, complaining of discomfort in right side where CT is located. Denies significant SOB.  Cardiovascular: S1 and S2, no rubs or gallops  Respiratory: decrease BS mid and lower sections on right lungs; no wheezing.   Abdomen: soft, NT, ND, positive BS  Musculoskeletal: no joint swelling, FROM  Data Reviewed: Basic Metabolic Panel:  Recent Labs Lab 10/01/13 1817 10/02/13 0134 10/03/13 0544 10/04/13 0355  NA 136* 135* 139 135*  K 3.2* 3.7 4.0 3.8  CL 95* 95* 102 100  CO2 27 27 26 24   GLUCOSE 154* 153* 117* 110*  BUN 28* 23 20 16   CREATININE 1.66* 1.59* 1.52* 1.24  CALCIUM 9.6 9.1 9.1 7.9*   Liver Function Tests:  Recent Labs Lab 10/02/13 0134  AST 33  ALT 32  ALKPHOS 107  BILITOT 1.0  PROT 7.0  ALBUMIN 2.3*   CBC:  Recent Labs Lab 10/01/13 1817 10/02/13 0134 10/03/13 0544 10/04/13 0355  WBC 17.2* 17.9* 18.9* 22.2*  HGB 12.3* 11.5* 10.5* 9.8*  HCT 36.6* 33.3* 30.8* 29.4*  MCV 75.6* 74.3* 75.7* 76.4*  PLT 274 246 268 255   Cardiac Enzymes:  Recent Labs Lab 10/01/13 1817  TROPONINI <0.30    Studies: Dg Chest Port 1 View  10/04/2013   CLINICAL DATA:  Postop day 3, status post right thoracotomy and drainage of right empyema with decortication.  EXAM: PORTABLE CHEST - 1 VIEW  COMPARISON:  DG CHEST 1V PORT dated 10/03/2013; CT CHEST W/CM dated 10/01/2013  FINDINGS: Three  right-sided chest tubes and the right internal jugular central venous catheter remain in place. Continued volume loss in the right hemithorax with right pleural effusion and adjacent airspace opacity. Subsegmental atelectasis peripherally at the left lung base. Right heart border obscured.  IMPRESSION: 1. Stable radiographic appearance of the chest.   Electronically Signed   By: Sherryl Barters M.D.   On: 10/04/2013 07:48   Dg Chest Portable 1 View  10/03/2013   CLINICAL DATA:  Postop, history pneumonia, asthma  EXAM: PORTABLE CHEST - 1 VIEW  COMPARISON:  CT CHEST  W/CM dated 10/01/2013; DG CHEST 2 VIEW dated 10/01/2013  FINDINGS: Grossly unchanged cardiac silhouette and mediastinal contours. Interval placement of 3 right-sided chest tubes and right jugular approach central venous catheter with tip projected over the superior cavoatrial junction. No pneumothorax. No definitive change in persistent moderate-sized right-sided pleural effusion and associated right mid and lower lung heterogeneous/consolidative opacities. Minimal linear heterogeneous opacities are seen within the left costophrenic angle and favored to represent subsegmental atelectasis. No new focal airspace opacities. No definite evidence of edema. Grossly unchanged bones.  IMPRESSION: 1. Support apparatus as above.  No pneumothorax. 2. No definitive interval change in persistent moderate-sized right-sided effusion and associated right mid and lower lung heterogeneous / consolidative opacities worrisome for infection.   Electronically Signed   By: Sandi Mariscal M.D.   On: 10/03/2013 13:15    Scheduled Meds: . acetaminophen  1,000 mg Oral 4 times per day   Or  . acetaminophen (TYLENOL) oral liquid 160 mg/5 mL  1,000 mg Oral 4 times per day  . bisacodyl  10 mg Oral Daily  . fentaNYL   Intravenous 6 times per day  . ferrous sulfate  325 mg Oral TID WC  . insulin aspart  0-24 Units Subcutaneous 6 times per day  . levalbuterol  0.63 mg Nebulization Q6H  . metoCLOPramide (REGLAN) injection  10 mg Intravenous 4 times per day  . piperacillin-tazobactam (ZOSYN)  IV  3.375 g Intravenous Q8H  . vancomycin  750 mg Intravenous Q12H   Continuous Infusions: . dextrose 5 % and 0.9% NaCl 125 mL/hr at 10/04/13 0744    Principal Problem:   Empyema Active Problems:   History of asthma   Dehydration   ARF (acute renal failure)   Microcytic anemia   Empyema, right    Time spent: >30 minutes   Dylen Mcelhannon  [facility] Pager [phone]. If 7PM-7AM, please contact night-coverage at [email or web address],  password [facility] 10/04/2013, 8:43 AM  LOS: 3 days

## 2013-10-04 NOTE — Progress Notes (Signed)
Patient ID: Darrell Woodard, male   DOB: 1956/01/31, 58 y.o.   MRN: [id number] EVENING ROUNDS NOTE :     [address]       [place] [address]             [phone]                 1 Day Post-Op Procedure(s) (LRB): VIDEO ASSISTED THORACOSCOPY (VATS)/EMPYEMA (Right)  Total Length of Stay:  LOS: 3 days  BP 130/63  Pulse 80  Temp(Src) 97.7 F (36.5 C) (Oral)  Resp 15  Ht 6\' 1"  (1.854 m)  Wt 174 lb 13.2 oz (79.3 kg)  BMI 23.07 kg/m2  SpO2 96%  .Intake/Output     03/02 0701 - 03/03 0700   P.O. 720   I.V. (mL/kg) 503.5 (6.3)   IV Piggyback 225   Total Intake(mL/kg) 1448.5 (18.3)   Urine (mL/kg/hr) 535 (0.5)   Other    Blood    Chest Tube 100 (0.1)   Total Output 635   Net +813.5         . sodium chloride 100 mL/hr at 10/04/13 0854     Lab Results  Component Value Date   WBC 22.2* 10/04/2013   HGB 9.8* 10/04/2013   HCT 29.4* 10/04/2013   PLT 255 10/04/2013   GLUCOSE 110* 10/04/2013   ALT 32 10/02/2013   AST 33 10/02/2013   NA 135* 10/04/2013   K 3.8 10/04/2013   CL 100 10/04/2013   CREATININE 1.24 10/04/2013   BUN 16 10/04/2013   CO2 24 10/04/2013   Not much out of chest tube today, ambulated  Grace Isaac MD  Beeper [phone] Office [phone] 10/04/2013 7:23 PM

## 2013-10-05 ENCOUNTER — Inpatient Hospital Stay (HOSPITAL_COMMUNITY): Payer: 59

## 2013-10-05 LAB — COMPREHENSIVE METABOLIC PANEL
ALBUMIN: 1.7 g/dL — AB (ref 3.5–5.2)
ALK PHOS: 83 U/L (ref 39–117)
ALT: 37 U/L (ref 0–53)
AST: 36 U/L (ref 0–37)
BUN: 11 mg/dL (ref 6–23)
CALCIUM: 8.2 mg/dL — AB (ref 8.4–10.5)
CO2: 25 mEq/L (ref 19–32)
Chloride: 101 mEq/L (ref 96–112)
Creatinine, Ser: 1.16 mg/dL (ref 0.50–1.35)
GFR calc Af Amer: 80 mL/min — ABNORMAL LOW (ref >90)
GFR calc non Af Amer: 69 mL/min — ABNORMAL LOW (ref >90)
Glucose, Bld: 103 mg/dL — ABNORMAL HIGH (ref 70–99)
POTASSIUM: 4.1 meq/L (ref 3.7–5.3)
SODIUM: 137 meq/L (ref 137–147)
TOTAL PROTEIN: 5.8 g/dL — AB (ref 6.0–8.3)
Total Bilirubin: 0.8 mg/dL (ref 0.3–1.2)

## 2013-10-05 LAB — CBC
HCT: 28.1 % — ABNORMAL LOW (ref 39.0–52.0)
HEMOGLOBIN: 9.4 g/dL — AB (ref 13.0–17.0)
MCH: 25.9 pg — AB (ref 26.0–34.0)
MCHC: 33.5 g/dL (ref 30.0–36.0)
MCV: 77.4 fL — ABNORMAL LOW (ref 78.0–100.0)
PLATELETS: 274 10*3/uL (ref 150–400)
RBC: 3.63 MIL/uL — ABNORMAL LOW (ref 4.22–5.81)
RDW: 14.4 % (ref 11.5–15.5)
WBC: 24.9 10*3/uL — ABNORMAL HIGH (ref 4.0–10.5)

## 2013-10-05 LAB — GLUCOSE, CAPILLARY
GLUCOSE-CAPILLARY: 108 mg/dL — AB (ref 70–99)
GLUCOSE-CAPILLARY: 92 mg/dL (ref 70–99)
Glucose-Capillary: 105 mg/dL — ABNORMAL HIGH (ref 70–99)

## 2013-10-05 MED ORDER — KETOROLAC TROMETHAMINE 15 MG/ML IJ SOLN: 15.0000 mg | Freq: Four times a day (QID) | INTRAMUSCULAR | Status: DC | PRN

## 2013-10-05 MED ORDER — VANCOMYCIN HCL IN DEXTROSE 1-5 GM/200ML-% IV SOLN
1000.0000 mg | Freq: Two times a day (BID) | INTRAVENOUS | Status: DC
  Administered 2013-10-05 – 2013-10-06 (×4): 1000 mg via INTRAVENOUS
  Filled 2013-10-05 (×6): qty 200

## 2013-10-05 NOTE — Progress Notes (Signed)
2 Days Post-Op Procedure(s) (LRB): VIDEO ASSISTED THORACOSCOPY (VATS)/EMPYEMA (Right) Subjective:  No complaints  Objective: Vital signs in last 24 hours: Temp:  [97.7 F (36.5 C)-100.3 F (37.9 C)] 98.7 F (37.1 C) (03/03 0727) Pulse Rate:  [63-104] 99 (03/03 0800) Cardiac Rhythm:  [-] Normal sinus rhythm (03/03 0800) Resp:  [10-27] 25 (03/03 0735) BP: (114-142)/(49-78) 135/78 mmHg (03/03 0800) SpO2:  [91 %-100 %] 97 % (03/03 0800) Weight:  [80.4 kg (177 lb 4 oz)] 80.4 kg (177 lb 4 oz) (03/03 0600)  Hemodynamic parameters for last 24 hours:    Intake/Output from previous day: 03/02 0701 - 03/03 0700 In: 2561 [P.O.:720; I.V.:1603.5; IV Piggyback:237.5] Out: 1750 [Urine:1450; Chest Tube:300] Intake/Output this shift: Total I/O In: 124.5 [I.V.:112; IV Piggyback:12.5] Out: -   General appearance: alert and cooperative Heart: regular rate and rhythm, S1, S2 normal, no murmur, click, rub or gallop Lungs: diminished breath sounds RLL Extremities: extremities normal, atraumatic, no cyanosis or edema Wound: dressing dry serosanguinous drainage.  Lab Results:  Recent Labs  10/04/13 0355 10/05/13 0420  WBC 22.2* 24.9*  HGB 9.8* 9.4*  HCT 29.4* 28.1*  PLT 255 274   BMET:  Recent Labs  10/04/13 0355 10/05/13 0420  NA 135* 137  K 3.8 4.1  CL 100 101  CO2 24 25  GLUCOSE 110* 103*  BUN 16 11  CREATININE 1.24 1.16  CALCIUM 7.9* 8.2*    PT/INR: No results found for this basename: LABPROT, INR,  in the last 72 hours ABG    Component Value Date/Time   PHART 7.396 10/04/2013 0407   HCO3 28.8* 10/04/2013 0407   TCO2 30 10/04/2013 0407   O2SAT 97.0 10/04/2013 0407   CBG (last 3)   Recent Labs  10/04/13 2355 10/05/13 0342 10/05/13 0725  GLUCAP 108* 92 105*    Assessment/Plan: S/P Procedure(s) (LRB): VIDEO ASSISTED THORACOSCOPY (VATS)/EMPYEMA (Right) He is stable overall  Continue chest tubes to suction  Cultures still pending. Continue vanc and zosyn  His  RLL is still not aerated on CXR . Continue IS, ambulation, coughing.  Leukocytosis: increasing but not fever. Observe.  His creatinine is back to normal so will try some toradol for pain.  He can go to 3S stepdown.   LOS: 4 days    BARTLE,BRYAN K 10/05/2013

## 2013-10-05 NOTE — Progress Notes (Signed)
Pt arrived from 2S, VSS, no complaints of pain, daughter at bedside, oriented to unit and routine, call bell within reach, CMT and elink notified. Will continue to monitor.

## 2013-10-05 NOTE — Progress Notes (Signed)
ANTIBIOTIC CONSULT NOTE - Follow-up  Pharmacy Consult for Vancomycin and Zosyn Indication: empyema  Allergies  Allergen Reactions  . Oxycodone Other (See Comments)    Hallucinations    Patient Measurements: Height: 6\' 1"  (185.4 cm) Weight: 177 lb 4 oz (80.4 kg) IBW/kg (Calculated) : 79.9  Vital Signs: Temp: 98.7 F (37.1 C) (03/03 0727) Temp src: Oral (03/03 0727) BP: 135/78 mmHg (03/03 0800) Pulse Rate: 99 (03/03 0800)  Labs:  Recent Labs  10/03/13 0544 10/04/13 0355 10/05/13 0420  WBC 18.9* 22.2* 24.9*  HGB 10.5* 9.8* 9.4*  PLT 268 255 274  CREATININE 1.52* 1.24 1.16   Estimated Creatinine Clearance: 80.4 ml/min (by C-G formula based on Cr of 1.16).   Microbiology: Recent Results (from the past 720 hour(s))  CULTURE, BLOOD (ROUTINE X 2)     Status: None   Collection Time    10/02/13  1:34 AM      Result Value Ref Range Status   Specimen Description BLOOD LEFT ARM   Final   Special Requests BOTTLES DRAWN AEROBIC ONLY 10CC   Final   Culture  Setup Time     Final   Value: 10/02/2013 10:45     Performed at [facility]   Culture     Final   Value:        BLOOD CULTURE RECEIVED NO GROWTH TO DATE CULTURE WILL BE HELD FOR 5 DAYS BEFORE ISSUING A FINAL NEGATIVE REPORT     Performed at [facility]   Report Status PENDING   Incomplete  CULTURE, BLOOD (ROUTINE X 2)     Status: None   Collection Time    10/02/13  1:40 AM      Result Value Ref Range Status   Specimen Description BLOOD RIGHT ARM   Final   Special Requests BOTTLES DRAWN AEROBIC ONLY 10CC   Final   Culture  Setup Time     Final   Value: 10/02/2013 10:46     Performed at [facility]   Culture     Final   Value:        BLOOD CULTURE RECEIVED NO GROWTH TO DATE CULTURE WILL BE HELD FOR 5 DAYS BEFORE ISSUING A FINAL NEGATIVE REPORT     Performed at [facility]   Report Status PENDING   Incomplete  SURGICAL PCR SCREEN     Status: None   Collection Time    10/03/13   6:11 AM      Result Value Ref Range Status   MRSA, PCR NEGATIVE  NEGATIVE Final   Staphylococcus aureus NEGATIVE  NEGATIVE Final   Comment:            The Xpert SA Assay (FDA     approved for NASAL specimens     in patients over 28 years of age),     is one component of     a comprehensive surveillance     program.  Test performance has     been validated by [employer] for patients greater     than or equal to 30 year old.     It is not intended     to diagnose infection nor to     guide or monitor treatment.  BODY FLUID CULTURE     Status: None   Collection Time    10/03/13 10:00 AM      Result Value Ref Range Status   Specimen Description FLUID RIGHT  PLEURAL   Final   Special Requests PATIENT ON FOLLOWING VANCO   Final   Gram Stain     Final   Value: WBC PRESENT, PREDOMINANTLY PMN     NO ORGANISMS SEEN     Performed at [facility]   Culture     Final   Value: NO GROWTH 1 DAY     Performed at [facility]   Report Status PENDING   Incomplete  ANAEROBIC CULTURE     Status: None   Collection Time    10/03/13 10:00 AM      Result Value Ref Range Status   Specimen Description FLUID RIGHT PLEURAL   Final   Special Requests PATIENT ON FOLLOWING VANCO   Final   Gram Stain     Final   Value: FEW WBC PRESENT,BOTH PMN AND MONONUCLEAR     RARE SQUAMOUS EPITHELIAL CELLS PRESENT     NO ORGANISMS SEEN     Performed at [facility]   Culture     Final   Value: NO ANAEROBES ISOLATED; CULTURE IN PROGRESS FOR 5 DAYS     Performed at [facility]   Report Status PENDING   Incomplete  TISSUE CULTURE     Status: None   Collection Time    10/03/13 10:08 AM      Result Value Ref Range Status   Specimen Description TISSUE RIGHT PLEURAL   Final   Special Requests PATIENT ON FOLLOWING VANCO PEEL   Final   Gram Stain     Final   Value: WBC PRESENT, PREDOMINANTLY PMN     FEW GRAM POSITIVE COCCI IN PAIRS     Performed at [facility]   Culture      Final   Value: NO GROWTH 1 DAY     Performed at [facility]   Report Status PENDING   Incomplete   Assessment: 58yo male c/o SOB w/ exertion and eventually progressed to SOB at rest, pain worsened w/ body movement, an urgent care clinic dx'd w/ pulled muscle and gave muscle relaxants which did not relieve Sx, pt then went to hospital where he was dx'd w/ PNA and given outpt Bactrim and azithro which failed to improve Sx, PCP sent pt to [facility]. CT demonstrated empyema and pt is now POD#2 VATS. Tmax 100.3 and WBC is 24.9, Scr is trending down to 1.16.   Vanc 2/28>> Cefepime 2/28>>  2/28 bld cx>>ngtd 3/1 body fluid cx>>NGTD 3/1 tissue cx>>NGTD  Goal of Therapy:  Vancomycin trough level 15-20 mcg/ml  Plan:  1. Change vancomycin to 1gm IV Q12H 2. Continue zosyn 3.375gm IV Q8H (4 hr inf) 3. F/u renal fxn, C&S, clinical status and trough at SS 4. F/u planned LOT  Salome Arnt, PharmD, BCPS Pager # [phone] 10/05/2013 8:56 AM

## 2013-10-05 NOTE — Progress Notes (Signed)
Report called to receiving RN, Jarrett Soho on [facility]. Pt safely transferred and bedside report given. Pts vitals WNL, GCS 15 and no complaints of pain upon handoff.

## 2013-10-06 ENCOUNTER — Inpatient Hospital Stay (HOSPITAL_COMMUNITY): Payer: 59

## 2013-10-06 LAB — CBC
HEMATOCRIT: 27 % — AB (ref 39.0–52.0)
Hemoglobin: 9 g/dL — ABNORMAL LOW (ref 13.0–17.0)
MCH: 25.5 pg — ABNORMAL LOW (ref 26.0–34.0)
MCHC: 33.3 g/dL (ref 30.0–36.0)
MCV: 76.5 fL — AB (ref 78.0–100.0)
Platelets: 308 10*3/uL (ref 150–400)
RBC: 3.53 MIL/uL — ABNORMAL LOW (ref 4.22–5.81)
RDW: 14.3 % (ref 11.5–15.5)
WBC: 22.3 10*3/uL — ABNORMAL HIGH (ref 4.0–10.5)

## 2013-10-06 LAB — BASIC METABOLIC PANEL
BUN: 11 mg/dL (ref 6–23)
CO2: 26 mEq/L (ref 19–32)
CREATININE: 1.12 mg/dL (ref 0.50–1.35)
Calcium: 8.6 mg/dL (ref 8.4–10.5)
Chloride: 102 mEq/L (ref 96–112)
GFR calc Af Amer: 83 mL/min — ABNORMAL LOW (ref >90)
GFR, EST NON AFRICAN AMERICAN: 72 mL/min — AB (ref >90)
GLUCOSE: 109 mg/dL — AB (ref 70–99)
POTASSIUM: 3.8 meq/L (ref 3.7–5.3)
Sodium: 140 mEq/L (ref 137–147)

## 2013-10-06 LAB — TISSUE CULTURE

## 2013-10-06 MED ORDER — WHITE PETROLATUM GEL
Status: AC
  Filled 2013-10-06: qty 5

## 2013-10-06 MED ORDER — LEVALBUTEROL HCL 0.63 MG/3ML IN NEBU
0.6300 mg | INHALATION_SOLUTION | Freq: Three times a day (TID) | RESPIRATORY_TRACT | Status: DC
  Administered 2013-10-06 – 2013-10-08 (×6): 0.63 mg via RESPIRATORY_TRACT
  Filled 2013-10-06 (×12): qty 3

## 2013-10-06 NOTE — Progress Notes (Addendum)
      [address]       [place] [address]             [phone]      3 Days Post-Op Procedure(s) (LRB): VIDEO ASSISTED THORACOSCOPY (VATS)/EMPYEMA (Right)  Subjective:  [name] has no new complaints this morning.  He did use his IS this morning and nursing reinforced the importance of routine use.   Objective: Vital signs in last 24 hours: Temp:  [98 F (36.7 C)-99.1 F (37.3 C)] 99.1 F (37.3 C) (03/04 0307) Pulse Rate:  [80-100] 88 (03/04 0307) Cardiac Rhythm:  [-] Normal sinus rhythm (03/04 0307) Resp:  [16-21] 18 (03/04 0800) BP: (110-148)/(57-116) 145/70 mmHg (03/04 0307) SpO2:  [96 %-97 %] 96 % (03/04 0800)  Intake/Output from previous day: 03/03 0701 - 03/04 0700 In: 1176.2 [P.O.:200; I.V.:738.7; IV Piggyback:237.5] Out: 1650 [Urine:1570; Chest Tube:80]  General appearance: alert, cooperative and no distress Heart: regular rate and rhythm Lungs: diminished breath sounds RLL Abdomen: soft, non-tender; bowel sounds normal; no masses,  no organomegaly Wound: clean and dry, staples in place  Lab Results:  Recent Labs  10/05/13 0420 10/06/13 0310  WBC 24.9* 22.3*  HGB 9.4* 9.0*  HCT 28.1* 27.0*  PLT 274 308   BMET:  Recent Labs  10/05/13 0420 10/06/13 0310  NA 137 140  K 4.1 3.8  CL 101 102  CO2 25 26  GLUCOSE 103* 109*  BUN 11 11  CREATININE 1.16 1.12  CALCIUM 8.2* 8.6    PT/INR: No results found for this basename: LABPROT, INR,  in the last 72 hours ABG    Component Value Date/Time   PHART 7.396 10/04/2013 0407   HCO3 28.8* 10/04/2013 0407   TCO2 30 10/04/2013 0407   O2SAT 97.0 10/04/2013 0407   CBG (last 3)   Recent Labs  10/04/13 2355 10/05/13 0342 10/05/13 0725  GLUCAP 108* 92 105*    Assessment/Plan: S/P Procedure(s) (LRB): VIDEO ASSISTED THORACOSCOPY (VATS)/EMPYEMA (Right)  1. Chest tubes- 3 remain in place, no air leak present, 80cc output yesterday- can hopefully remove 1 chest tube soon 2. Empyema- low  grade fever today, Leukocytosis stable, OR cultures remain negative- continue Vanc and Zosyn per pharmacy 3. Pulm- continued atelectasis/effusion RLL continue IS, cough 4. Creatinine mildly elevated at 1.12, okay to continue Toradol for pain 5. Decrease IV Fluids to [facility], patient tolerating diet 6. Dispo- patient stable, continue current care   LOS: 5 days    Ellwood Handler 10/06/2013   Chart reviewed, patient examined, agree with above. Doing well, ambulating. Pain under control. Anterior chest tube removed today. Cultures remain negative but he had pus in the pleural space. Continue vanc and zosyn.

## 2013-10-06 NOTE — Op Note (Signed)
10/03/2013 Darrell Woodard [id number]  Surgeon: Gaye Pollack, MD   First Assistant: Lars Pinks, PA-C  Preoperative Diagnosis: right empyema   Postoperative Diagnosis: right empyema   Procedure:  1. Right thoracotomy  2. Drainage of empyema  3. Decortication of the right lung   Anesthesia: General Endotracheal   Clinical History/Surgical Indication:   The patient is a 58 year old nonsmoker with asthma who developed some right chest pain and shortness of breath last Saturday. He went to an Urgent Care on Sunday and says he was told he had a pulled muscle. He was given muscle relaxer and sent home. He took the medication Sunday and Monday but did not feel better and on Monday went to [facility] where he had a CXR and bloodwork and was told he had pneumonia. He says he was given IV fluids, and IV antibiotic injection and felt better so he was sent home on Bactrim and Azithromycin. He took these but did not feel better and followed up with his primary MD on Friday who referred him to the ER. He went to [facility]. A Chest CT scan showed a multiloculated right pleural empyema and he was transferred to [facility] overnight. He has a right empyema with fever, leukocytosis, chest pain and shortness of breath. He will require right VATS, possible mini-thoracotomy for drainage and decortication of the lung. I discussed the procedure with the patient and his family including alternatives, benefits, and risks, including but not limited to bleeding, infection, injury to the lung, and postop air leak. He understands and agrees to proceed.    Preparation:  The patient was seen in the preoperative holding area and the correct patient, correct operation, correct operative sidewere confirmed with the patient after reviewing the medical record and CT scan. The consent was signed by me. Preoperative antibiotics were given. The right side of the chest was signed by me. The patient was taken back to  the operating room and positioned supine on the operating room table. After being placed under general endotracheal anesthesia by the anesthesia team using a double lumen tube a foley catheter was placed. The patient was turned into the left lateral decubitus position. The chest was prepped with betadine soap and solution. A surgical time-out was taken and the correct patient,operative side, and operative procedure were confirmed with the nursing and anesthesia staff.   Operative Procedure:  A 1 cm incision was made in the mid-axillary line at about the 8th intercostal space and an 8 mm trocar was inserted into the pleural space. It was immediately apparent that the pleural space was obliterated by the multi-loculated empyema and that this could not be done effectively by the VATS approach. Therefore a short lateral thoracotomy incision was made and the chest entered through the 8th ICS. The pleural space was filled with a white purulent multiloculated empyema a thick fibrinous peel over the lung. The empyema was completely drained and the entire right lung decorticated. This allowed complete expansion of the lung. The chest was irrigated with warm saline. Hemostasis was complete. Three 77 F Bard drains were placed through separate stab incisions and were positioned posteriorly,anteriorly, and at the base in the pleural space. The ribs were reapproximated with # 2 vicryl pericostal sutures and the muscles closed with continuous 0 vicryl suture. The subcutaneous tissue was closed with 2-0 vicryl continuous suture. The skin was closed with 3-0 vicryl subcuticular suture. All sponge, needle, and instrument counts were reported correct at the end of  the case. Dry sterile dressings were placed over the incisions and around the chest tubes which were connected to pleurevac suction. The patient was turned supine, extubated,then transported to the PACU in satisfactory and stable condition.

## 2013-10-07 ENCOUNTER — Inpatient Hospital Stay (HOSPITAL_COMMUNITY): Payer: 59

## 2013-10-07 LAB — CBC
HEMATOCRIT: 27.1 % — AB (ref 39.0–52.0)
Hemoglobin: 8.9 g/dL — ABNORMAL LOW (ref 13.0–17.0)
MCH: 25.4 pg — ABNORMAL LOW (ref 26.0–34.0)
MCHC: 32.8 g/dL (ref 30.0–36.0)
MCV: 77.2 fL — ABNORMAL LOW (ref 78.0–100.0)
Platelets: 362 10*3/uL (ref 150–400)
RBC: 3.51 MIL/uL — ABNORMAL LOW (ref 4.22–5.81)
RDW: 14.5 % (ref 11.5–15.5)
WBC: 22.7 10*3/uL — ABNORMAL HIGH (ref 4.0–10.5)

## 2013-10-07 LAB — BODY FLUID CULTURE: CULTURE: NO GROWTH

## 2013-10-07 LAB — BASIC METABOLIC PANEL
BUN: 10 mg/dL (ref 6–23)
CALCIUM: 8.6 mg/dL (ref 8.4–10.5)
CHLORIDE: 101 meq/L (ref 96–112)
CO2: 25 mEq/L (ref 19–32)
CREATININE: 1.14 mg/dL (ref 0.50–1.35)
GFR calc non Af Amer: 70 mL/min — ABNORMAL LOW (ref >90)
GFR, EST AFRICAN AMERICAN: 81 mL/min — AB (ref >90)
Glucose, Bld: 106 mg/dL — ABNORMAL HIGH (ref 70–99)
Potassium: 3.7 mEq/L (ref 3.7–5.3)
Sodium: 139 mEq/L (ref 137–147)

## 2013-10-07 LAB — VANCOMYCIN, TROUGH: VANCOMYCIN TR: 9.8 ug/mL — AB (ref 10.0–20.0)

## 2013-10-07 MED ORDER — VANCOMYCIN HCL IN DEXTROSE 1-5 GM/200ML-% IV SOLN
1000.0000 mg | Freq: Three times a day (TID) | INTRAVENOUS | Status: DC
  Administered 2013-10-07 – 2013-10-09 (×6): 1000 mg via INTRAVENOUS
  Filled 2013-10-07 (×9): qty 200

## 2013-10-07 NOTE — Progress Notes (Signed)
Utilization review completed.  

## 2013-10-07 NOTE — Progress Notes (Signed)
ANTIBIOTIC CONSULT NOTE - Follow-up  Pharmacy Consult for Vancomycin and Zosyn Indication: empyema  Allergies  Allergen Reactions  . Oxycodone Other (See Comments)    Hallucinations    Patient Measurements: Height: 6\' 1"  (185.4 cm) Weight: 177 lb 4 oz (80.4 kg) IBW/kg (Calculated) : 79.9  Vital Signs: Temp: 97.8 F (36.6 C) (03/05 1100) Temp src: Oral (03/05 1100) BP: 130/72 mmHg (03/05 0734) Pulse Rate: 92 (03/05 0917)  Labs:  Recent Labs  10/05/13 0420 10/06/13 0310 10/07/13 0450  WBC 24.9* 22.3* 22.7*  HGB 9.4* 9.0* 8.9*  PLT 274 308 362  CREATININE 1.16 1.12 1.14   Estimated Creatinine Clearance: 81.8 ml/min (by C-G formula based on Cr of 1.14).   Microbiology: Recent Results (from the past 720 hour(s))  CULTURE, BLOOD (ROUTINE X 2)     Status: None   Collection Time    10/02/13  1:34 AM      Result Value Ref Range Status   Specimen Description BLOOD LEFT ARM   Final   Special Requests BOTTLES DRAWN AEROBIC ONLY 10CC   Final   Culture  Setup Time     Final   Value: 10/02/2013 10:45     Performed at [facility]   Culture     Final   Value:        BLOOD CULTURE RECEIVED NO GROWTH TO DATE CULTURE WILL BE HELD FOR 5 DAYS BEFORE ISSUING A FINAL NEGATIVE REPORT     Performed at [facility]   Report Status PENDING   Incomplete  CULTURE, BLOOD (ROUTINE X 2)     Status: None   Collection Time    10/02/13  1:40 AM      Result Value Ref Range Status   Specimen Description BLOOD RIGHT ARM   Final   Special Requests BOTTLES DRAWN AEROBIC ONLY 10CC   Final   Culture  Setup Time     Final   Value: 10/02/2013 10:46     Performed at [facility]   Culture     Final   Value:        BLOOD CULTURE RECEIVED NO GROWTH TO DATE CULTURE WILL BE HELD FOR 5 DAYS BEFORE ISSUING A FINAL NEGATIVE REPORT     Performed at [facility]   Report Status PENDING   Incomplete  SURGICAL PCR SCREEN     Status: None   Collection Time    10/03/13  6:11  AM      Result Value Ref Range Status   MRSA, PCR NEGATIVE  NEGATIVE Final   Staphylococcus aureus NEGATIVE  NEGATIVE Final   Comment:            The Xpert SA Assay (FDA     approved for NASAL specimens     in patients over 28 years of age),     is one component of     a comprehensive surveillance     program.  Test performance has     been validated by [employer] for patients greater     than or equal to 87 year old.     It is not intended     to diagnose infection nor to     guide or monitor treatment.  BODY FLUID CULTURE     Status: None   Collection Time    10/03/13 10:00 AM      Result Value Ref Range Status   Specimen Description FLUID RIGHT  PLEURAL   Final   Special Requests PATIENT ON FOLLOWING VANCO   Final   Gram Stain     Final   Value: WBC PRESENT, PREDOMINANTLY PMN     NO ORGANISMS SEEN     Performed at [facility]   Culture     Final   Value: NO GROWTH 3 DAYS     Performed at [facility]   Report Status 10/07/2013 FINAL   Final  ANAEROBIC CULTURE     Status: None   Collection Time    10/03/13 10:00 AM      Result Value Ref Range Status   Specimen Description FLUID RIGHT PLEURAL   Final   Special Requests PATIENT ON FOLLOWING VANCO   Final   Gram Stain     Final   Value: FEW WBC PRESENT,BOTH PMN AND MONONUCLEAR     RARE SQUAMOUS EPITHELIAL CELLS PRESENT     NO ORGANISMS SEEN     Performed at [facility]   Culture     Final   Value: NO ANAEROBES ISOLATED; CULTURE IN PROGRESS FOR 5 DAYS     Performed at [facility]   Report Status PENDING   Incomplete  TISSUE CULTURE     Status: None   Collection Time    10/03/13 10:08 AM      Result Value Ref Range Status   Specimen Description TISSUE RIGHT PLEURAL   Final   Special Requests PATIENT ON FOLLOWING VANCO PEEL   Final   Gram Stain     Final   Value: WBC PRESENT, PREDOMINANTLY PMN     FEW GRAM POSITIVE COCCI IN PAIRS     Performed at [facility]   Culture      Final   Value: MULTIPLE ORGANISMS PRESENT, NONE PREDOMINANT     Note: NO STAPHYLOCOCCUS AUREUS ISOLATED NO GROUP A STREP (S.PYOGENES) ISOLATED     Performed at [facility]   Report Status 10/06/2013 FINAL   Final   Assessment: 58yo male c/o SOB w/ exertion and eventually progressed to SOB at rest, pain worsened w/ body movement, an urgent care clinic dx'd w/ pulled muscle and gave muscle relaxants which did not relieve Sx, pt then went to hospital where he was dx'd w/ PNA and given outpt Bactrim and azithro which failed to improve Sx, PCP sent pt to [facility]. CT demonstrated empyema and pt is now POD#4 VATS. Vanc/zosyn day # 6. Tmax 99.5 and WBC is 22.7, Scr is 1.14.  Vanc trough is 9.8 this morning on vancomycin 1 gm IV q12h.  VT less than goal of 15-20 mcg/ml.  Still has chest tubes with 70 cc output yesterday.   Vanc 2/28>> Cefepime 2/28>>  Vanc trough 3/5 9.8 mcg/ml  2/28 bld cx>>ngtd 3/1 body fluid cx>>NG F 3/1 tissue cx>>NG F  Goal of Therapy:  Vancomycin trough level 15-20 mcg/ml  Plan:  1. increase vancomycin to 1gm IV Q8H 2. Continue zosyn 3.375gm IV Q8H (4 hr inf) 3. F/u for LOT Eudelia Bunch, Pharm.D. [phone] 10/07/2013 1:36 PM

## 2013-10-07 NOTE — Progress Notes (Addendum)
      [address]       [place] [address]             [phone]      4 Days Post-Op Procedure(s) (LRB): VIDEO ASSISTED THORACOSCOPY (VATS)/EMPYEMA (Right)  Subjective:  Mr. Lezotte has no complaints.  He feels pretty good this morning.  Pain remains well controlled.  + BM  Objective: Vital signs in last 24 hours: Temp:  [98 F (36.7 C)-99.5 F (37.5 C)] 98.8 F (37.1 C) (03/05 0734) Pulse Rate:  [76-94] 88 (03/05 0734) Cardiac Rhythm:  [-] Normal sinus rhythm (03/05 0734) Resp:  [14-21] 18 (03/05 0734) BP: (121-157)/(68-93) 130/72 mmHg (03/05 0734) SpO2:  [94 %-100 %] 100 % (03/05 0734)  Intake/Output from previous day: 03/04 0701 - 03/05 0700 In: 600 [P.O.:360; I.V.:240] Out: 1950 [Urine:1890; Chest Tube:60] Intake/Output this shift: Total I/O In: -  Out: 10 [Chest Tube:10]  General appearance: alert, cooperative and no distress Heart: regular rate and rhythm Lungs: diminished breath sounds RLL Abdomen: soft, non-tender; bowel sounds normal; no masses,  no organomegaly Wound: clean and dry  Lab Results:  Recent Labs  10/06/13 0310 10/07/13 0450  WBC 22.3* 22.7*  HGB 9.0* 8.9*  HCT 27.0* 27.1*  PLT 308 362   BMET:  Recent Labs  10/06/13 0310 10/07/13 0450  NA 140 139  K 3.8 3.7  CL 102 101  CO2 26 25  GLUCOSE 109* 106*  BUN 11 10  CREATININE 1.12 1.14  CALCIUM 8.6 8.6    PT/INR: No results found for this basename: LABPROT, INR,  in the last 72 hours ABG    Component Value Date/Time   PHART 7.396 10/04/2013 0407   HCO3 28.8* 10/04/2013 0407   TCO2 30 10/04/2013 0407   O2SAT 97.0 10/04/2013 0407   CBG (last 3)   Recent Labs  10/04/13 2355 10/05/13 0342 10/05/13 0725  GLUCAP 108* 92 105*    Assessment/Plan: S/P Procedure(s) (LRB): VIDEO ASSISTED THORACOSCOPY (VATS)/EMPYEMA (Right)  1. Chest tube- 70 cc output yesterday, no evidence of air leak, chest tubes on water seal 2. Pulm- no acute issues, some continued  atelectasis in right base, continue IS 3. Emypema- fever resolved, Leukocytosis stable, OR tissue culture positive for multiple organisms (none specifically identified)- continue Vanc and Zosyn 4. Renal- creatinine remains stable 5. Dispo- patient stable, likely remove 2nd chest tube soon, continue current care   LOS: 6 days    BARRETT, ERIN 10/07/2013   Chart reviewed, patient examined, agree with above. Minimal chest tube output. CXR stable. Can remove both chest tubes in am. Plan home Saturday am on Augmentin for a week.

## 2013-10-08 ENCOUNTER — Inpatient Hospital Stay (HOSPITAL_COMMUNITY): Payer: 59

## 2013-10-08 LAB — BASIC METABOLIC PANEL
BUN: 9 mg/dL (ref 6–23)
CALCIUM: 8.6 mg/dL (ref 8.4–10.5)
CO2: 27 mEq/L (ref 19–32)
CREATININE: 1.21 mg/dL (ref 0.50–1.35)
Chloride: 102 mEq/L (ref 96–112)
GFR calc non Af Amer: 65 mL/min — ABNORMAL LOW (ref >90)
GFR, EST AFRICAN AMERICAN: 76 mL/min — AB (ref >90)
Glucose, Bld: 101 mg/dL — ABNORMAL HIGH (ref 70–99)
Potassium: 4.2 mEq/L (ref 3.7–5.3)
Sodium: 139 mEq/L (ref 137–147)

## 2013-10-08 LAB — ANAEROBIC CULTURE

## 2013-10-08 LAB — CULTURE, BLOOD (ROUTINE X 2)
CULTURE: NO GROWTH
Culture: NO GROWTH

## 2013-10-08 LAB — CBC
HCT: 25.3 % — ABNORMAL LOW (ref 39.0–52.0)
Hemoglobin: 8.3 g/dL — ABNORMAL LOW (ref 13.0–17.0)
MCH: 25.3 pg — ABNORMAL LOW (ref 26.0–34.0)
MCHC: 32.8 g/dL (ref 30.0–36.0)
MCV: 77.1 fL — ABNORMAL LOW (ref 78.0–100.0)
Platelets: 367 10*3/uL (ref 150–400)
RBC: 3.28 MIL/uL — ABNORMAL LOW (ref 4.22–5.81)
RDW: 14.6 % (ref 11.5–15.5)
WBC: 21.5 10*3/uL — ABNORMAL HIGH (ref 4.0–10.5)

## 2013-10-08 MED ORDER — LEVALBUTEROL HCL 0.63 MG/3ML IN NEBU: 0.6300 mg | INHALATION_SOLUTION | Freq: Four times a day (QID) | RESPIRATORY_TRACT | Status: DC | PRN

## 2013-10-08 NOTE — Progress Notes (Addendum)
       [address]       [place] [address]             [phone]          5 Days Post-Op Procedure(s) (LRB): VIDEO ASSISTED THORACOSCOPY (VATS)/EMPYEMA (Right)  Subjective: Feels well. Just back from walking in halls.  No complaints.   Objective: Vital signs in last 24 hours: Patient Vitals for the past 24 hrs:  BP Temp Temp src Pulse Resp SpO2  10/08/13 0700 - 98.4 F (36.9 C) - - - -  10/08/13 0345 - 98.5 F (36.9 C) Oral - 18 94 %  10/08/13 0328 130/69 mmHg - - 98 22 96 %  10/08/13 0042 - - - - 19 95 %  10/07/13 2340 127/77 mmHg 100.1 F (37.8 C) Oral 82 17 96 %  10/07/13 2054 - - - - - 97 %  10/07/13 2035 129/72 mmHg 98.7 F (37.1 C) Oral 77 19 96 %  10/07/13 1546 124/76 mmHg 98.1 F (36.7 C) Oral 92 23 99 %  10/07/13 1100 113/53 mmHg 97.8 F (36.6 C) Oral 89 18 98 %  10/07/13 0917 - - - 92 22 100 %   Current Weight  10/05/13 177 lb 4 oz (80.4 kg)     Intake/Output from previous day: 03/05 0701 - 03/06 0700 In: 2845 [P.O.:1320; I.V.:425; IV Piggyback:1100] Out: 2835 [Urine:2825; Chest Tube:10]    PHYSICAL EXAM:  Heart: RRR Lungs: Slightly diminished BS in R base Wound: Clean and dry Chest tube: No air leak    Lab Results: CBC: Recent Labs  10/07/13 0450 10/08/13 0520  WBC 22.7* 21.5*  HGB 8.9* 8.3*  HCT 27.1* 25.3*  PLT 362 367   BMET:  Recent Labs  10/07/13 0450 10/08/13 0520  NA 139 139  K 3.7 4.2  CL 101 102  CO2 25 27  GLUCOSE 106* 101*  BUN 10 9  CREATININE 1.14 1.21  CALCIUM 8.6 8.6    PT/INR: No results found for this basename: LABPROT, INR,  in the last 72 hours  CXR: FINDINGS:  Right chest tubes are unchanged in position. No diagnostic  pneumothorax. Stable right IJ central line position. Persistent  small right pleural effusion with right basilar atelectasis or  infiltrate. Left lung is clear. No pulmonary edema.  IMPRESSION:  Stable right chest tubes. Persistent small right pleural  effusion  with right basilar atelectasis or infiltrate.   Assessment/Plan: S/P Procedure(s) (LRB): VIDEO ASSISTED THORACOSCOPY (VATS)/EMPYEMA (Right) CXR stable, CT with no air leak and minimal drainage.  Will d/c remaining CTs. ID- leukocytosis stable, no fever.  Continue Vanc/Zosyn.  Plan home on po Augmentin x 1 week. Mild RI- Cr up slightly today.  Watch. Continue pulm toilet, ambulation. Home in am if remains stable.   LOS: 7 days    COLLINS,GINA H 10/08/2013   Chart reviewed, patient examined, agree with above. Tubes are out and he feels good. Plan home in the am on Augmentin. He will need to have CT sutures out in the office in a week.

## 2013-10-08 NOTE — Progress Notes (Signed)
45ml fentanyl PCA wasted in sink Kerry Kass RN to witness waste.

## 2013-10-08 NOTE — Progress Notes (Signed)
Last two remaining chest tube removed per MD order as well as right double lumen IJ. Pt tolerated procedures well. Will continue to monitor.

## 2013-10-08 NOTE — Discharge Summary (Signed)
[address]       [place] [address]             [phone]              Discharge Summary  Name: Darrell Woodard DOB: June 09, 1956 58 y.o. MRN: [id number]   Admission Date: 10/01/2013 Discharge Date: 10/09/2013    Admitting Diagnosis: Shortness of breath Right sided chest pain   Discharge Diagnosis:  Right empyema  Past Medical History  Diagnosis Date  . Asthma   . Pneumonia      Procedures: RIGHT THORACOTOMY, DRAINAGE OF EMPYEMA, RIGHT LUNG DECORTICATION - 10/03/2013    HPI:  The patient is a 58 y.o. male nonsmoker with asthma who developed some right chest pain and shortness of breath last Saturday. He went to an Urgent Care on Sunday and says he was told he had a pulled muscle. He was given muscle relaxers and sent home. He took the medication Sunday and Monday but did not feel better, and on Monday went to [facility] where he had a chest x-ray and bloodwork and was told he had pneumonia. He says he was given IV fluids and an IV antibiotic injection and felt better so he was sent home on Bactrim and Azithromycin. He took these but improve, so he followed up with his primary MD on Friday who referred him to the ER at [facility]. A Chest CT scan there showed a multiloculated right pleural empyema. He was transferred to [facility] for further evaluation and treatment.    Hospital Course:  The patient was admitted to [facility] on 10/01/2013. He was noted to be febrile, with leukocytosis.  He was started on IV antibiotics and a thoracic surgery evaluation was requested.  Dr. Cyndia Bent saw the patient and reviewed his films. It was felt that he would require a right VATS for drainage of his empyema.  All risks, benefits and alternatives of surgery were explained in detail, and the patient agreed to proceed. The patient was taken to the operating room and underwent the above procedure.    Postoperatively, he was continued on broad spectrum IV  antibiotics.  Intraoperative cultures revealed multiple organisms, with none specifically identified.  His fevers have resolved, but he has a persistent leukocytosis, which has been stable and slowly improving.  Overall, he is doing well.  Chest tubes have been removed in the standard fashion and chest x-rays have remained stable, with a persistent right basilar infiltrate/atelectasis.  He has been treated with aggressive pulmonary toilet measures and incentive spirometry and has been weaned from supplemental oxygen.  He is ambulating in the halls without problem. Incisions are healing well.  He is currently medically stable for discharge home on today's date to continue oral antibiotics for an additional week.     Recent vital signs:  Filed Vitals:   10/08/13 0700  BP: 121/78  Pulse: 77  Temp: 98.4 F (36.9 C)  Resp: 17    Recent laboratory studies:  CBC: Recent Labs  10/07/13 0450 10/08/13 0520  WBC 22.7* 21.5*  HGB 8.9* 8.3*  HCT 27.1* 25.3*  PLT 362 367   BMET:  Recent Labs  10/07/13 0450 10/08/13 0520  NA 139 139  K 3.7 4.2  CL 101 102  CO2 25 27  GLUCOSE 106* 101*  BUN 10 9  CREATININE 1.14 1.21  CALCIUM 8.6 8.6    PT/INR: No results found for this basename: LABPROT, INR,  in  the last 72 hours   Discharge Medications:     Medication List    STOP taking these medications       azithromycin 250 MG tablet  Commonly known as:  ZITHROMAX     ibuprofen 800 MG tablet  Commonly known as:  ADVIL,MOTRIN     oxyCODONE-acetaminophen 5-325 MG per tablet  Commonly known as:  PERCOCET/ROXICET     sulfamethoxazole-trimethoprim 800-160 MG per tablet  Commonly known as:  BACTRIM DS      TAKE these medications       amoxicillin-clavulanate 875-125 MG per tablet  Commonly known as:  AUGMENTIN  Take 1 tablet by mouth 2 (two) times daily. X 7 days     ferrous sulfate 325 (65 FE) MG tablet  Take 1 tablet (325 mg total) by mouth 3 (three) times daily with meals.       traMADol 50 MG tablet  Commonly known as:  ULTRAM  Take 1 tablet (50 mg total) by mouth every 6 (six) hours as needed for moderate pain.         Discharge Instructions:  The patient is to refrain from driving, heavy lifting or strenuous activity.  May shower daily and clean incisions with soap and water.  May resume regular diet.   Follow Up: Follow-up Information   Follow up with Gaye Pollack, MD On 10/27/2013. (Have a chest x-ray at [place] at 9:00, then see MD at 10:00)    Specialty:  Cardiothoracic Surgery   Contact information:   [address] [place] [address] [phone]       Follow up with [facility] On 10/15/2013. (For suture removal at 10:30)            Suzann Lazaro H 10/08/2013, 9:57 AM

## 2013-10-09 ENCOUNTER — Inpatient Hospital Stay (HOSPITAL_COMMUNITY): Payer: 59

## 2013-10-09 MED ORDER — TRAMADOL HCL 50 MG PO TABS: 50.0000 mg | ORAL_TABLET | Freq: Four times a day (QID) | ORAL | Status: DC | PRN

## 2013-10-09 MED ORDER — FERROUS SULFATE 325 (65 FE) MG PO TABS: 325.0000 mg | ORAL_TABLET | Freq: Three times a day (TID) | ORAL | Status: DC

## 2013-10-09 MED ORDER — AMOXICILLIN-POT CLAVULANATE 875-125 MG PO TABS: 1.0000 | ORAL_TABLET | Freq: Two times a day (BID) | ORAL | Status: AC

## 2013-10-09 NOTE — Progress Notes (Signed)
       [address]       [place] [address]             [phone]          6 Days Post-Op Procedure(s) (LRB): VIDEO ASSISTED THORACOSCOPY (VATS)/EMPYEMA (Right)  Subjective: Feels well, ready to go home.  Objective: Vital signs in last 24 hours: Patient Vitals for the past 24 hrs:  BP Temp Temp src Pulse Resp SpO2  10/09/13 0730 110/70 mmHg 98.7 F (37.1 C) Oral - - -  10/09/13 0406 - 98.7 F (37.1 C) Oral - - -  10/09/13 0405 118/54 mmHg - - 81 19 96 %  10/09/13 0006 116/64 mmHg 98.6 F (37 C) Oral 87 20 98 %  10/09/13 0000 - - - 72 17 97 %  10/08/13 2000 125/70 mmHg 98.3 F (36.8 C) Oral 83 21 100 %  10/08/13 1619 120/88 mmHg 98.4 F (36.9 C) Oral 83 20 98 %  10/08/13 1300 116/75 mmHg 98.9 F (37.2 C) Oral 80 19 97 %  10/08/13 1012 - - - - - 100 %   Current Weight  10/05/13 177 lb 4 oz (80.4 kg)     Intake/Output from previous day: 03/06 0701 - 03/07 0700 In: 820 [I.V.:20; IV Piggyback:800] Out: 1050 [Urine:1050]    PHYSICAL EXAM:  Heart: RRR Lungs: Slightly decreased BS on R Wound: Clean and dry    Lab Results: CBC: Recent Labs  10/07/13 0450 10/08/13 0520  WBC 22.7* 21.5*  HGB 8.9* 8.3*  HCT 27.1* 25.3*  PLT 362 367   BMET:  Recent Labs  10/07/13 0450 10/08/13 0520  NA 139 139  K 3.7 4.2  CL 101 102  CO2 25 27  GLUCOSE 106* 101*  BUN 10 9  CREATININE 1.14 1.21  CALCIUM 8.6 8.6    PT/INR: No results found for this basename: LABPROT, INR,  in the last 72 hours  CXR: FINDINGS:  Right chest tubes removed. Small loculations of suspected pleural  gas along the moderate right basilar pleural fluid. Passive  atelectasis in the right lower lobe and right middle lobe. No large  pneumothorax noted.  Right IJ line has been removed. There is fluid in the right major  fissure. A left mid lung nodule appears similar to the recent CT.  IMPRESSION:  1. Chest tubes and IJ line have been removed. There is a residual    moderate right pleural effusion with associated passive atelectasis,  and several small loculations of gas in the remaining subpulmonic  pleural effusion.  2. Stable left mid lung pulmonary nodule -malignancy not excluded.   Assessment/Plan: S/P Procedure(s) (LRB): VIDEO ASSISTED THORACOSCOPY (VATS)/EMPYEMA (Right) Doing well.  Plan d/c home today- instructions reviewed with patient.   LOS: 8 days    Melody Savidge H 10/09/2013

## 2013-10-09 NOTE — Progress Notes (Signed)
Provided pt with discharge instructions and scripts. Pt and family member verbalized understanding. VSS-stated will take morning medicines when at home. PIV removed. No signs of drainage or infection at VAT's site.

## 2013-10-15 ENCOUNTER — Ambulatory Visit (INDEPENDENT_AMBULATORY_CARE_PROVIDER_SITE_OTHER): Payer: Self-pay

## 2013-10-15 DIAGNOSIS — Z9889 Other specified postprocedural states: Secondary | ICD-10-CM

## 2013-10-15 DIAGNOSIS — Z4802 Encounter for removal of sutures: Secondary | ICD-10-CM

## 2013-10-15 DIAGNOSIS — D381 Neoplasm of uncertain behavior of trachea, bronchus and lung: Secondary | ICD-10-CM

## 2013-10-15 NOTE — Progress Notes (Signed)
Removed 3 sutures from chest tube sites. Removed 11 staples from thoracotomy incision. No signs of infection and patient tolerated well.

## 2013-10-19 ENCOUNTER — Other Ambulatory Visit: Payer: Self-pay | Admitting: *Deleted

## 2013-10-19 DIAGNOSIS — J869 Pyothorax without fistula: Secondary | ICD-10-CM

## 2013-10-27 ENCOUNTER — Ambulatory Visit: Payer: 59 | Admitting: Surgery

## 2013-11-03 ENCOUNTER — Ambulatory Visit (INDEPENDENT_AMBULATORY_CARE_PROVIDER_SITE_OTHER): Payer: Self-pay | Admitting: Surgery

## 2013-11-03 ENCOUNTER — Ambulatory Visit
Admission: RE | Admit: 2013-11-03 | Discharge: 2013-11-03 | Disposition: A | Discharge status: Home / Self Care | Payer: 59 | Source: Ambulatory Visit | Attending: Surgery | Admitting: Surgery

## 2013-11-03 ENCOUNTER — Encounter: Payer: Self-pay | Admitting: Surgery

## 2013-11-03 VITALS — BP 122/75 | HR 66 | Resp 18 | Ht 73.0 in | Wt 168.0 lb

## 2013-11-03 DIAGNOSIS — J869 Pyothorax without fistula: Secondary | ICD-10-CM

## 2013-11-03 DIAGNOSIS — Z9889 Other specified postprocedural states: Secondary | ICD-10-CM

## 2013-11-03 DIAGNOSIS — Z09 Encounter for follow-up examination after completed treatment for conditions other than malignant neoplasm: Secondary | ICD-10-CM

## 2013-11-03 NOTE — Progress Notes (Signed)
      HPI:  Patient returns for routine postoperative follow-up having undergone right thoracotomy for drainage of an empyema and decortication of the right lung on 10/03/2013. The patient's early postoperative recovery while in the hospital was notable for an uncomplicated postop course. Since hospital discharge the patient reports that he has been feeling well. He has mild incisional discomfort with coughing. He denies shortness of breath, fever, or significant cough.   Current Outpatient Prescriptions  Medication Sig Dispense Refill  . amoxicillin-clavulanate (AUGMENTIN) 875-125 MG per tablet Take 1 tablet by mouth 2 (two) times daily. X 7 days  14 tablet  0   No current facility-administered medications for this visit.    Physical Exam: BP 122/75  Pulse 66  Resp 18  Ht 6\' 1"  (1.854 m)  Wt 168 lb (76.204 kg)  BMI 22.17 kg/m2  SpO2 98% He looks well Lung exam reveals slight decrease in breath sounds at the right base. The chest incisions are healing well  Diagnostic Tests:  There is improved aeration of the right lung. There is a small residual effusion or atelectasis blunting the right costophrenic angle.  Impression:  Overall I think he is doing well. I encouraged him to continue walking.  I told him he could drive his car but should not lift anything heavier than 10 lbs for three months postop.   Plan:  He will continue to follow up with his primary physician in [place] and will return to see me if he develops any problems with his incision.

## 2014-06-26 IMAGING — CT CT CHEST W/ CM
2 of 3 series · 15 of 36 positions shown, 18 images · IV contrast (omnipaque)
Comparison: DG CHEST 2 VIEW dated 10/01/2013

CLINICAL DATA: Shortness of breath and chest pain.

EXAM:
CT CHEST WITH CONTRAST
TECHNIQUE: Multidetector CT imaging of the chest was performed during
intravenous contrast administration.
CONTRAST:  80mL OMNIPAQUE IOHEXOL 300 MG/ML  SOLN

[Series 2: chestroutine 5.0 b40f · axial · 0.72mm/px · z∈[-52,+218]mm · 12 of 64 slices shown, 15 images]
[im 5/64  mediastinal]
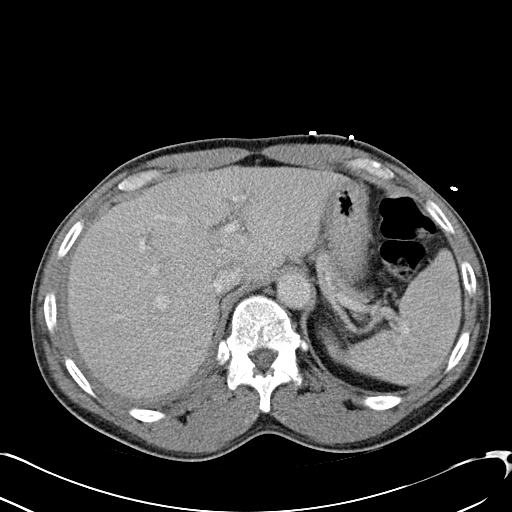
[im 5/64  lung]
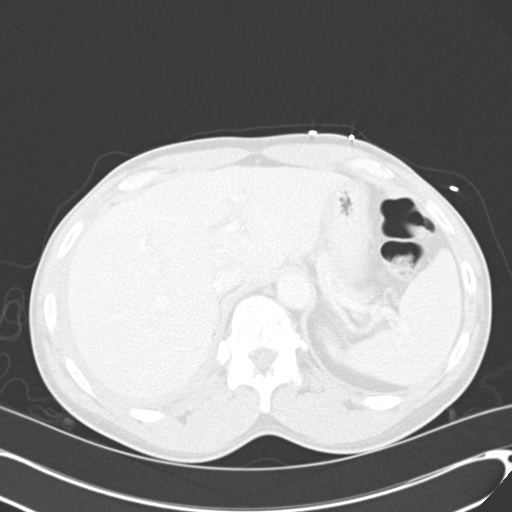
[im 10/64  lung]
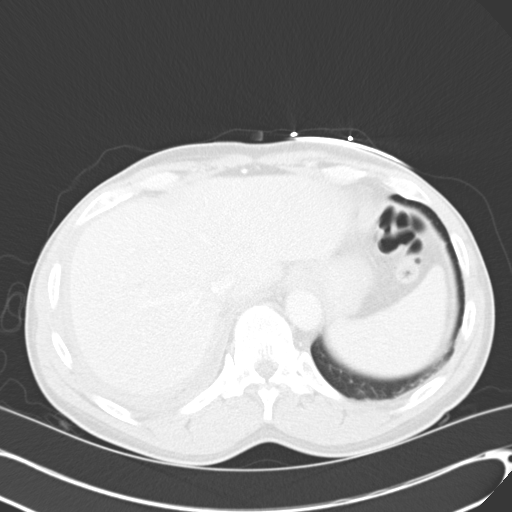
[im 15/64  lung]
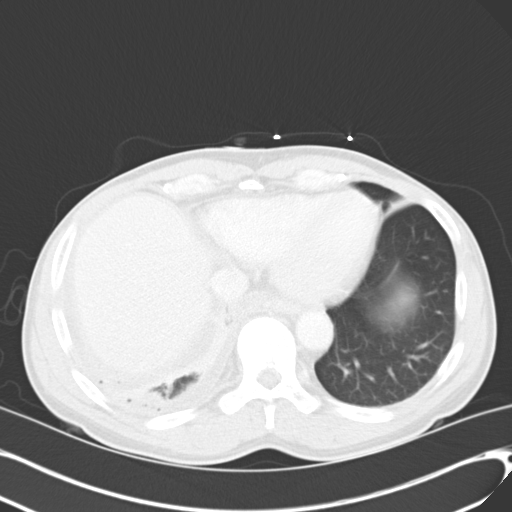
[im 19/64  lung]
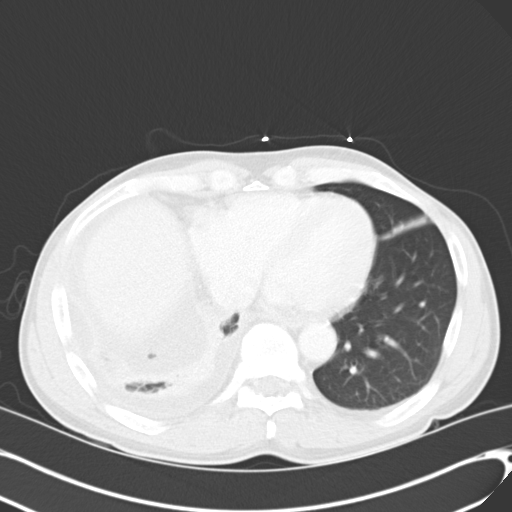
[im 24/64  mediastinal]
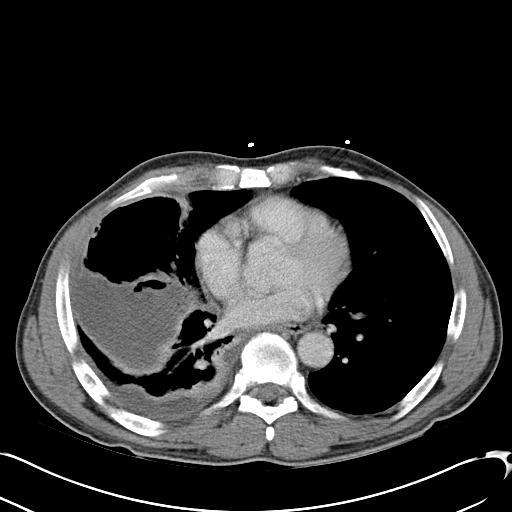
[im 24/64  lung]
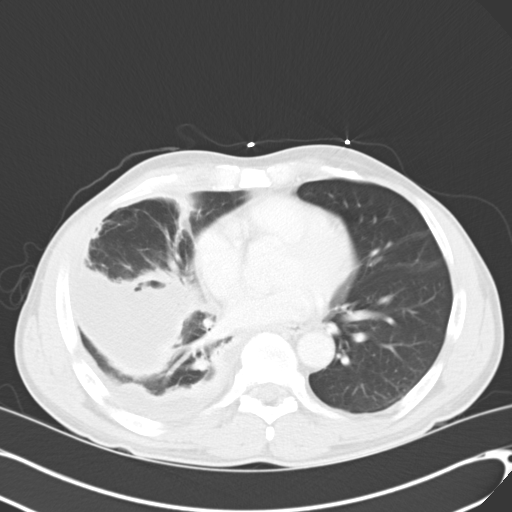
[im 29/64  lung]
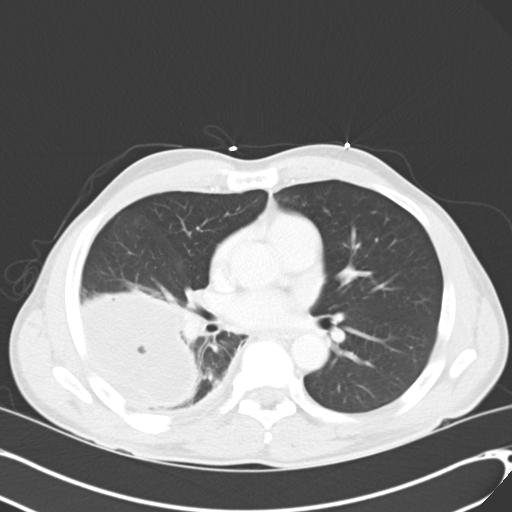
[im 36/64  lung]
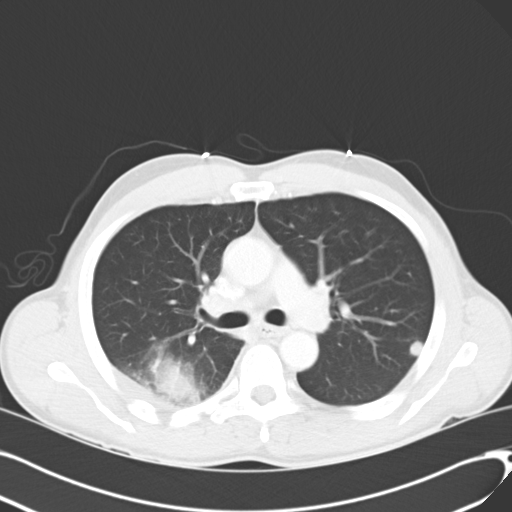
[im 40/64  lung]
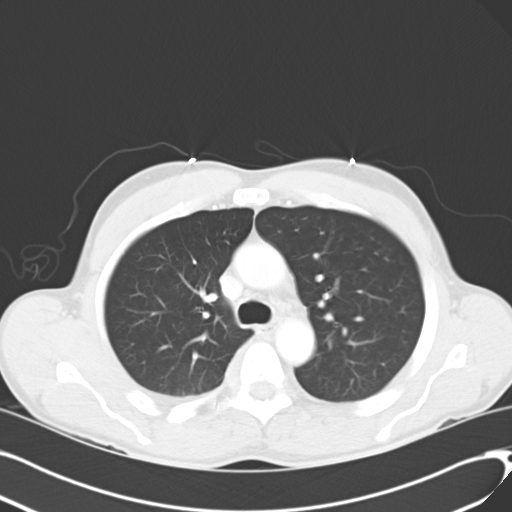
[im 45/64  mediastinal]
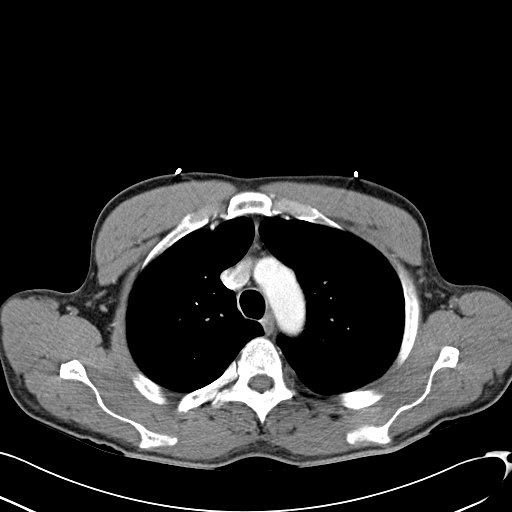
[im 45/64  lung]
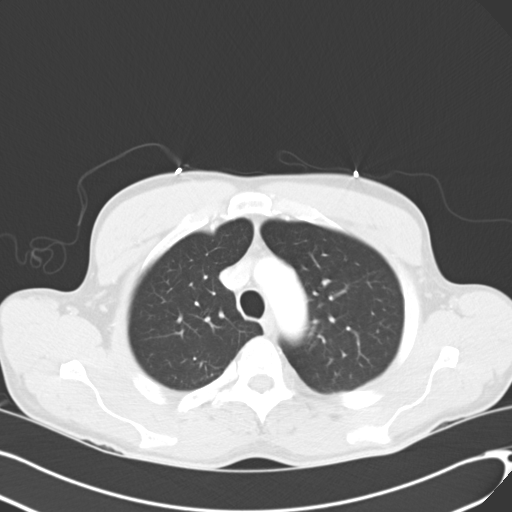
[im 50/64  lung]
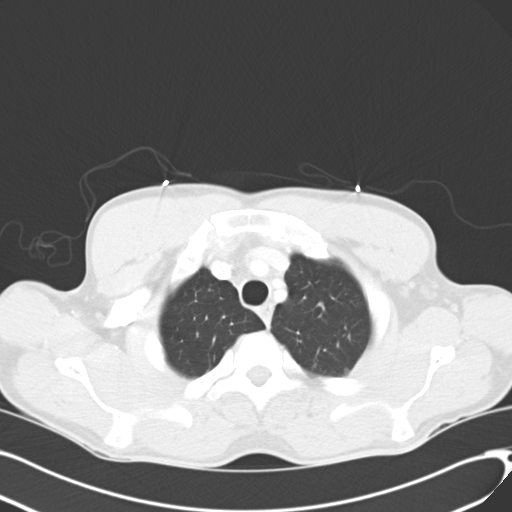
[im 54/64  lung]
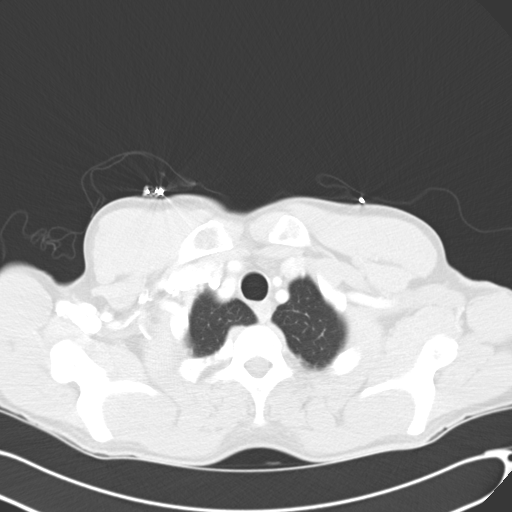
[im 59/64  lung]
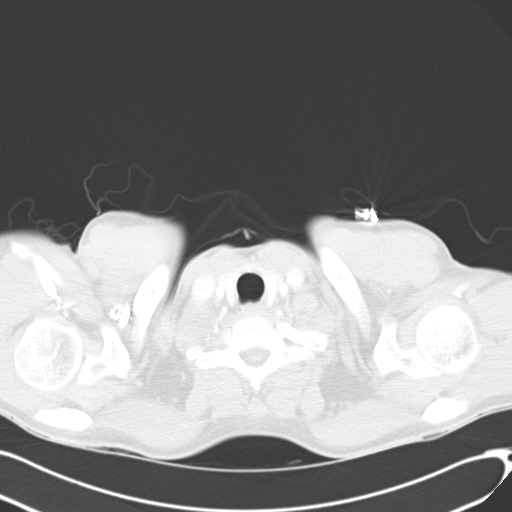

[Series 4: mpr coronal chest 3mm · coronal · 0.69mm/px · 3 of 78 slices shown]
[im 16/78  lung]
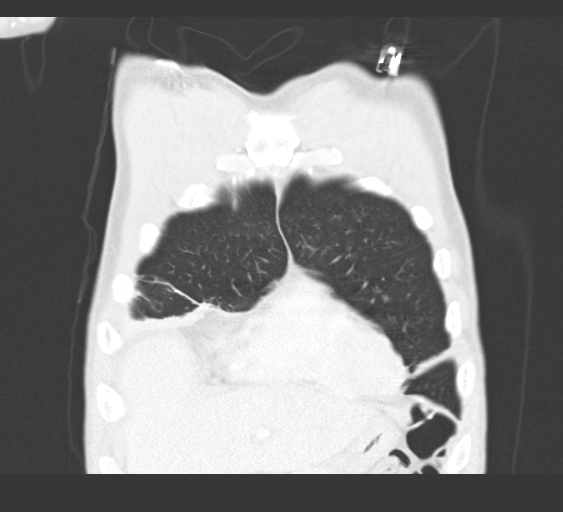
[im 31/78  lung]
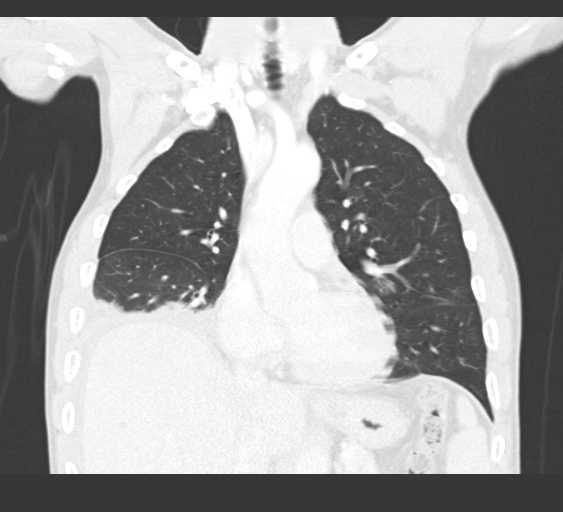
[im 47/78  lung]
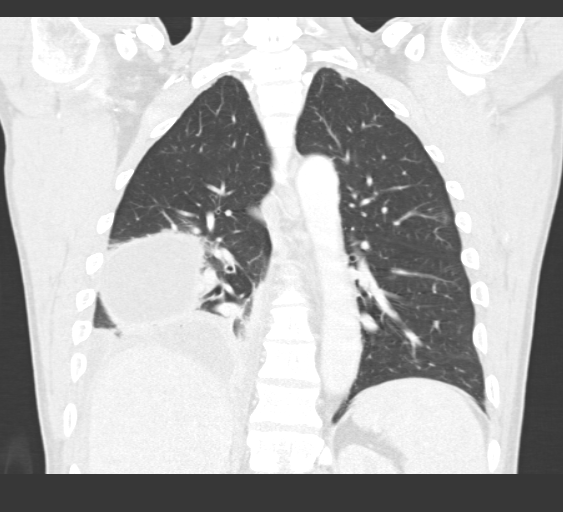

[15 of 36 positions shown; findings below may reference images not displayed]

FINDINGS: No pathologically enlarged mediastinal, hilar or axillary lymph
nodes. Heart size normal. No pericardial effusion. There are small
juxta diaphragmatic lymph nodes.

A highly loculated collection of pleural fluid and air is seen on
the right with slight pleural thickening. Largest collection of
fluid is seen along the right major fissure. There is associated
compressive atelectasis in the adjacent portions of the right lung.
A nodule in the subpleural left upper lobe measures 9 x 10 mm (image
29). Airway is unremarkable.

Incidental imaging of the upper abdomen suggests slight irregularity
of the left hepatic lobe margin. Scattered sub cm low-attenuation
lesions in the liver are too small to characterize. Visualized
portions of the left adrenal gland, left kidney, spleen, pancreas,
stomach and bowel are grossly unremarkable. No worrisome lytic or
sclerotic lesions.
IMPRESSION: 1. Highly loculated collection of pleural fluid and air in the right
hemi thorax, indicative of empyema. Difficult to definitively
exclude malignancy.
2. 10 mm left upper lobe nodule. Initial followup CT in 3 months is
recommended in further evaluation, as adenocarcinoma can have this
appearance. This recommendation follows the consensus statement:
Guidelines for Management of Small Pulmonary Nodules Detected on CT
Scans: A Statement from the [HOSPITAL] as published in
3. These results were called by telephone at the time of
interpretation on 10/01/2013 at [DATE] to Dr. LEKH SEHAR , who
verbally acknowledged these results.
4. Question mild cirrhosis.

## 2014-06-30 IMAGING — CR DG CHEST 1V PORT
1 series · 1 of 1 positions shown · non-contrast
Comparison: DG CHEST 1V PORT dated 10/04/2013

CLINICAL DATA: Status post be 8 CS, chest tube

EXAM:
PORTABLE CHEST - 1 VIEW

[AP]
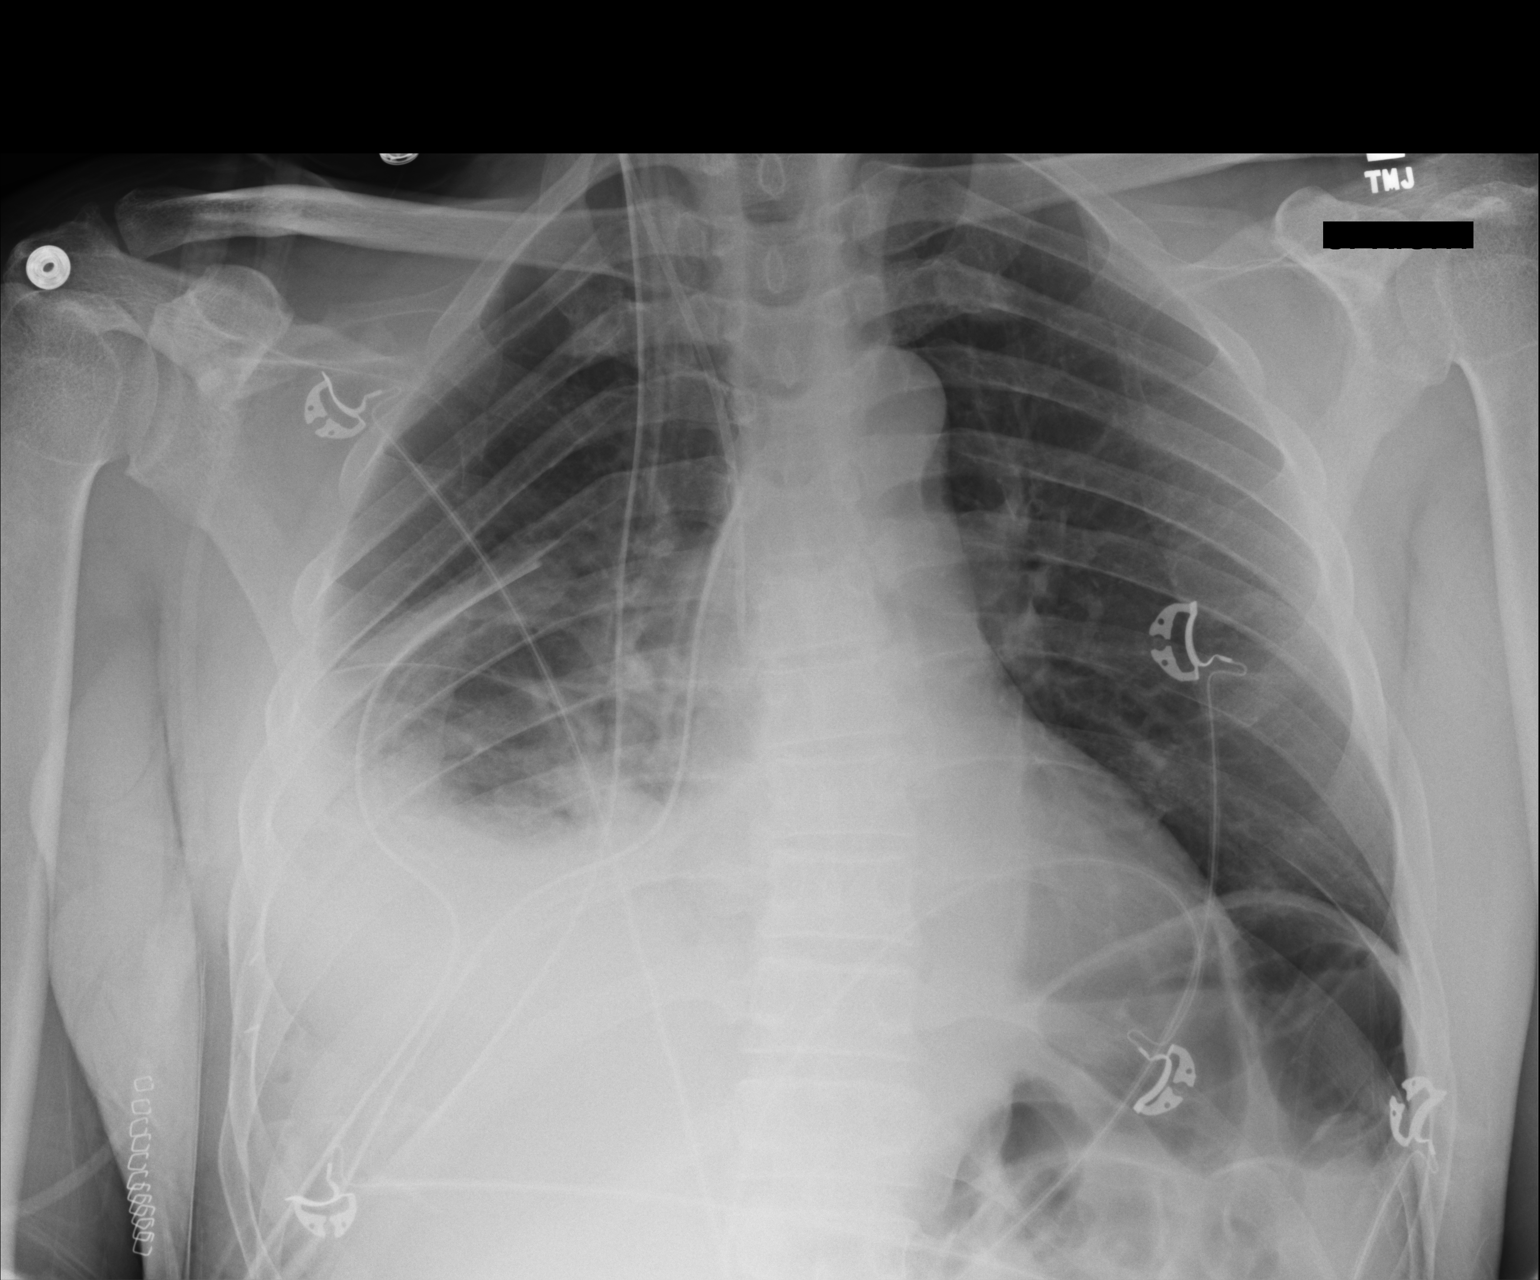

[1 of 1 positions shown; findings below may reference images not displayed]

FINDINGS: The left lung is well-expanded and clear. On the right. The 3 chest
tubes are unchanged in appearance. The lung is mildly hypoinflated
but slightly improved overall in appearance. There remains increased
density at the right lung base with obscuration of the
hemidiaphragm. The mediastinum is not shifted. The cardiac
silhouette is not enlarged. The right internal jugular venous
catheter tip arise in the region of the midportion of the SVC.
IMPRESSION: There has been slight interval improvement in the appearance of the
right hemithorax although atelectasis and a small amount of pleural
fluid on the right persists. There is no evidence of a pneumothorax.
No atelectasis at the left lung base is demonstrated today.

## 2014-07-01 IMAGING — CR DG CHEST 1V PORT
1 series · 1 of 1 positions shown · non-contrast
Comparison: 10/05/2013

CLINICAL DATA: Right thoracotomy/drainage of empyema

EXAM:
PORTABLE CHEST - 1 VIEW

[AP]
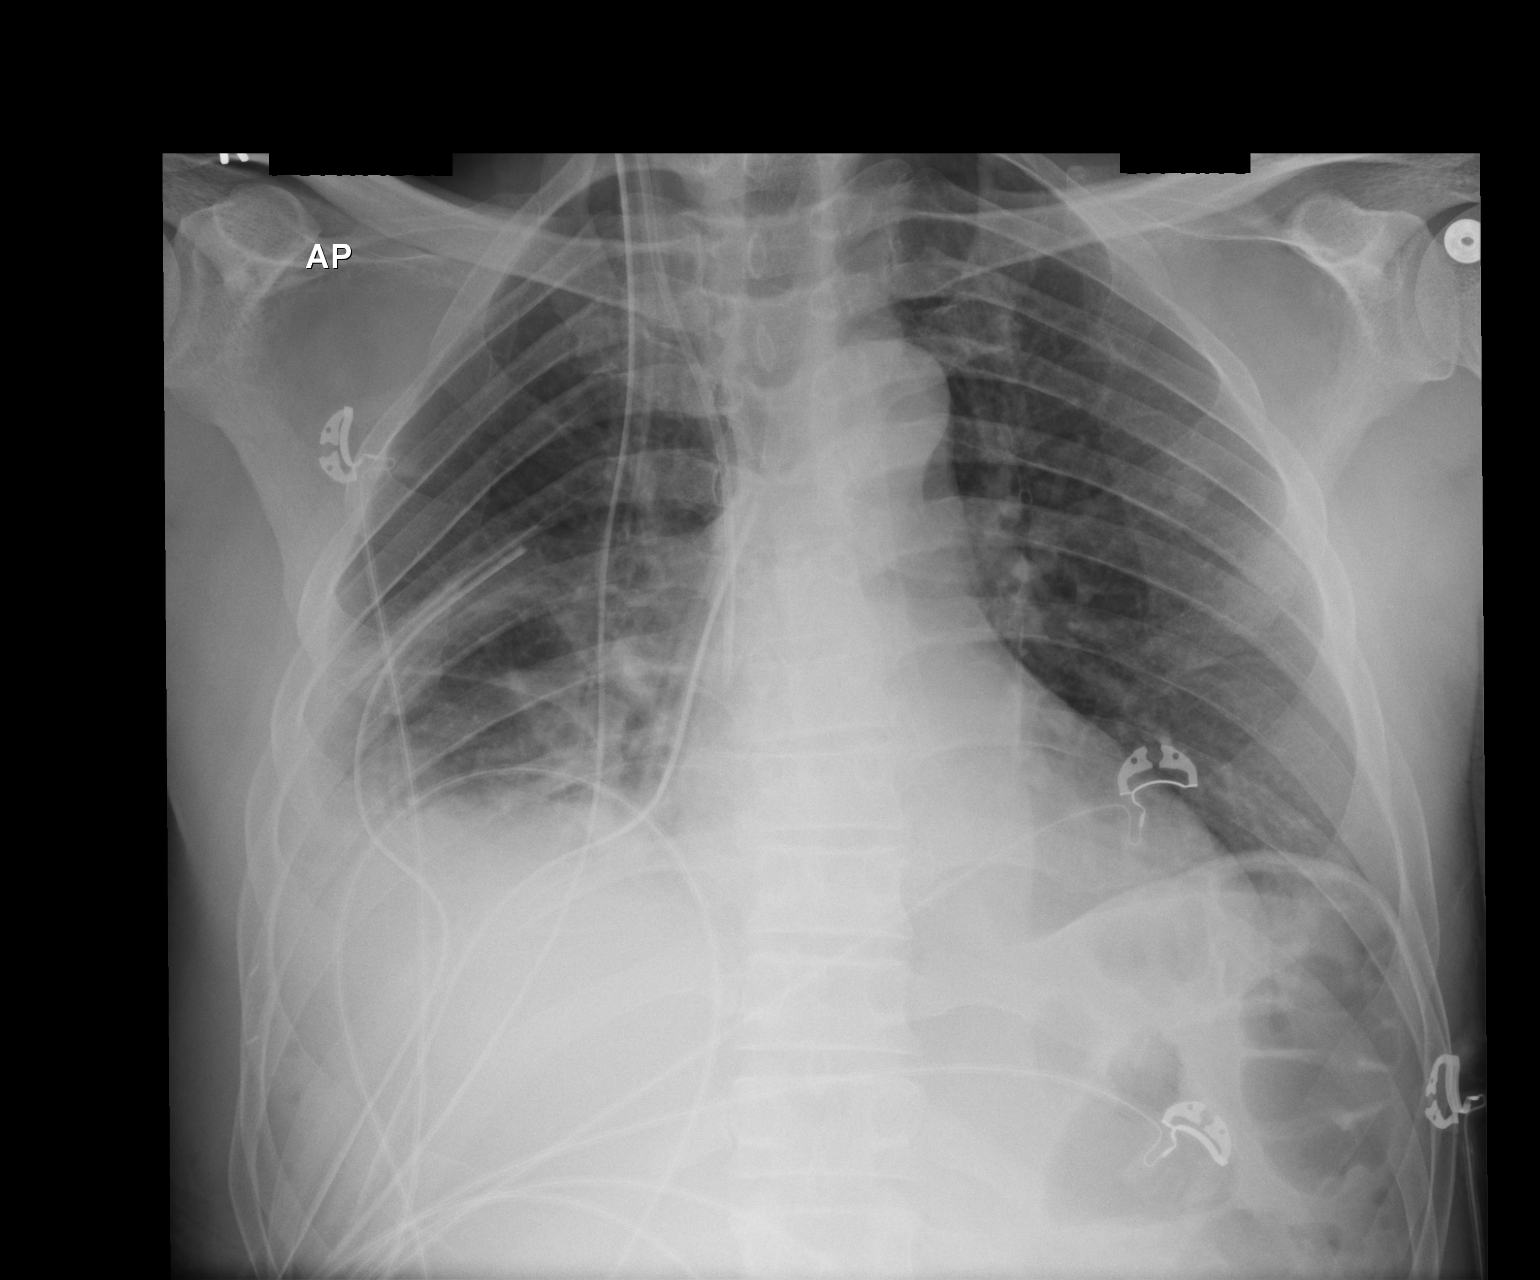

[1 of 1 positions shown; findings below may reference images not displayed]

FINDINGS: Three indwelling right chest tubes. Residual small right pleural
effusion. Associated right lower lobe opacity, likely atelectasis.
No pneumothorax is seen.

Visualized left lung is clear.

The heart is normal in size.

Right IJ venous catheter terminates at the cavoatrial junction.
IMPRESSION: Three indwelling right chest tubes.  No pneumothorax is seen.

Residual small right pleural effusion. Associated right lower lobe
opacity, likely atelectasis.
